# Patient Record
Sex: Female | Born: 1975 | Race: Black or African American | Hispanic: No | Marital: Single | Smoking: Never smoker
Health system: Southern US, Community
[De-identification: ages and names within clinical notes are randomized; demographics above are authoritative.]

## PROBLEM LIST (undated history)

## (undated) DIAGNOSIS — I10 Essential (primary) hypertension: Secondary | ICD-10-CM

## (undated) DIAGNOSIS — K219 Gastro-esophageal reflux disease without esophagitis: Secondary | ICD-10-CM

## (undated) DIAGNOSIS — R519 Headache, unspecified: Secondary | ICD-10-CM

## (undated) DIAGNOSIS — T7840XA Allergy, unspecified, initial encounter: Secondary | ICD-10-CM

## (undated) DIAGNOSIS — E119 Type 2 diabetes mellitus without complications: Secondary | ICD-10-CM

## (undated) HISTORY — DX: Allergy, unspecified, initial encounter: T78.40XA

## (undated) HISTORY — DX: Gastro-esophageal reflux disease without esophagitis: K21.9

## (undated) HISTORY — DX: Essential (primary) hypertension: I10

## (undated) HISTORY — PX: OTHER SURGICAL HISTORY: SHX169

## (undated) HISTORY — PX: BREAST BIOPSY: SHX20

---

## 1997-12-27 ENCOUNTER — Emergency Department (HOSPITAL_COMMUNITY): Admission: EM | Admit: 1997-12-27 | Discharge: 1997-12-27 | Payer: Self-pay | Admitting: Emergency Medicine

## 1998-01-12 ENCOUNTER — Other Ambulatory Visit: Admission: RE | Admit: 1998-01-12 | Discharge: 1998-01-12 | Payer: Self-pay | Admitting: Obstetrics

## 1998-02-15 ENCOUNTER — Emergency Department (HOSPITAL_COMMUNITY): Admission: EM | Admit: 1998-02-15 | Discharge: 1998-02-15 | Payer: Self-pay | Admitting: Emergency Medicine

## 1998-04-22 ENCOUNTER — Other Ambulatory Visit: Admission: RE | Admit: 1998-04-22 | Discharge: 1998-04-22 | Payer: Self-pay | Admitting: Obstetrics

## 1998-05-20 ENCOUNTER — Emergency Department (HOSPITAL_COMMUNITY): Admission: EM | Admit: 1998-05-20 | Discharge: 1998-05-20 | Payer: Self-pay | Admitting: Emergency Medicine

## 1998-09-30 ENCOUNTER — Ambulatory Visit (HOSPITAL_BASED_OUTPATIENT_CLINIC_OR_DEPARTMENT_OTHER): Admission: RE | Admit: 1998-09-30 | Discharge: 1998-09-30 | Payer: Self-pay | Admitting: General Surgery

## 1999-07-20 ENCOUNTER — Other Ambulatory Visit: Admission: RE | Admit: 1999-07-20 | Discharge: 1999-07-20 | Payer: Self-pay | Admitting: *Deleted

## 1999-07-25 ENCOUNTER — Encounter: Admission: RE | Admit: 1999-07-25 | Discharge: 1999-07-25 | Payer: Self-pay | Admitting: *Deleted

## 1999-07-25 ENCOUNTER — Encounter: Payer: Self-pay | Admitting: *Deleted

## 1999-09-28 ENCOUNTER — Other Ambulatory Visit: Admission: RE | Admit: 1999-09-28 | Discharge: 1999-09-28 | Payer: Self-pay | Admitting: Obstetrics & Gynecology

## 2000-02-10 ENCOUNTER — Inpatient Hospital Stay (HOSPITAL_COMMUNITY): Admission: AD | Admit: 2000-02-10 | Discharge: 2000-02-12 | Payer: Self-pay | Admitting: Obstetrics & Gynecology

## 2000-03-20 ENCOUNTER — Other Ambulatory Visit: Admission: RE | Admit: 2000-03-20 | Discharge: 2000-03-20 | Payer: Self-pay | Admitting: Obstetrics and Gynecology

## 2000-05-13 ENCOUNTER — Emergency Department (HOSPITAL_COMMUNITY): Admission: EM | Admit: 2000-05-13 | Discharge: 2000-05-13 | Payer: Self-pay | Admitting: *Deleted

## 2000-05-26 ENCOUNTER — Emergency Department (HOSPITAL_COMMUNITY): Admission: EM | Admit: 2000-05-26 | Discharge: 2000-05-27 | Payer: Self-pay | Admitting: *Deleted

## 2017-11-07 ENCOUNTER — Ambulatory Visit (INDEPENDENT_AMBULATORY_CARE_PROVIDER_SITE_OTHER): Payer: BC Managed Care – PPO | Admitting: Obstetrics & Gynecology

## 2017-11-07 ENCOUNTER — Encounter: Payer: Self-pay | Admitting: Obstetrics & Gynecology

## 2017-11-07 VITALS — BP 171/114 | HR 114 | Ht 67.0 in | Wt 200.7 lb

## 2017-11-07 DIAGNOSIS — N760 Acute vaginitis: Secondary | ICD-10-CM | POA: Diagnosis not present

## 2017-11-07 DIAGNOSIS — I10 Essential (primary) hypertension: Secondary | ICD-10-CM | POA: Diagnosis not present

## 2017-11-07 DIAGNOSIS — N920 Excessive and frequent menstruation with regular cycle: Secondary | ICD-10-CM

## 2017-11-07 DIAGNOSIS — Z1151 Encounter for screening for human papillomavirus (HPV): Secondary | ICD-10-CM | POA: Diagnosis not present

## 2017-11-07 DIAGNOSIS — Z01411 Encounter for gynecological examination (general) (routine) with abnormal findings: Secondary | ICD-10-CM

## 2017-11-07 DIAGNOSIS — Z1231 Encounter for screening mammogram for malignant neoplasm of breast: Secondary | ICD-10-CM | POA: Diagnosis not present

## 2017-11-07 DIAGNOSIS — B9689 Other specified bacterial agents as the cause of diseases classified elsewhere: Secondary | ICD-10-CM

## 2017-11-07 DIAGNOSIS — Z124 Encounter for screening for malignant neoplasm of cervix: Secondary | ICD-10-CM

## 2017-11-07 DIAGNOSIS — Z113 Encounter for screening for infections with a predominantly sexual mode of transmission: Secondary | ICD-10-CM | POA: Diagnosis not present

## 2017-11-07 DIAGNOSIS — Z01419 Encounter for gynecological examination (general) (routine) without abnormal findings: Secondary | ICD-10-CM

## 2017-11-07 MED ORDER — NIFEDIPINE ER 60 MG PO TB24: 60.0000 mg | ORAL_TABLET | Freq: Every day | ORAL | 3 refills | Status: DC

## 2017-11-07 MED ORDER — TRANEXAMIC ACID 650 MG PO TABS
1300.0000 mg | ORAL_TABLET | Freq: Three times a day (TID) | ORAL | 5 refills | Status: DC
Start: 2017-11-07 — End: 2017-11-22

## 2017-11-07 NOTE — Patient Instructions (Signed)
Preventive Care 18-39 Years, Female Preventive care refers to lifestyle choices and visits with your health care provider that can promote health and wellness. What does preventive care include?  A yearly physical exam. This is also called an annual well check.  Dental exams once or twice a year.  Routine eye exams. Ask your health care provider how often you should have your eyes checked.  Personal lifestyle choices, including: ? Daily care of your teeth and gums. ? Regular physical activity. ? Eating a healthy diet. ? Avoiding tobacco and drug use. ? Limiting alcohol use. ? Practicing safe sex. ? Taking vitamin and mineral supplements as recommended by your health care provider. What happens during an annual well check? The services and screenings done by your health care provider during your annual well check will depend on your age, overall health, lifestyle risk factors, and family history of disease. Counseling Your health care provider may ask you questions about your:  Alcohol use.  Tobacco use.  Drug use.  Emotional well-being.  Home and relationship well-being.  Sexual activity.  Eating habits.  Work and work Statistician.  Method of birth control.  Menstrual cycle.  Pregnancy history.  Screening You may have the following tests or measurements:  Height, weight, and BMI.  Diabetes screening. This is done by checking your blood sugar (glucose) after you have not eaten for a while (fasting).  Blood pressure.  Lipid and cholesterol levels. These may be checked every 5 years starting at age 66.  Skin check.  Hepatitis C blood test.  Hepatitis B blood test.  Sexually transmitted disease (STD) testing.  BRCA-related cancer screening. This may be done if you have a family history of breast, ovarian, tubal, or peritoneal cancers.  Pelvic exam and Pap test. This may be done every 3 years starting at age 40. Starting at age 59, this may be done every 5  years if you have a Pap test in combination with an HPV test.  Discuss your test results, treatment options, and if necessary, the need for more tests with your health care provider. Vaccines Your health care provider may recommend certain vaccines, such as:  Influenza vaccine. This is recommended every year.  Tetanus, diphtheria, and acellular pertussis (Tdap, Td) vaccine. You may need a Td booster every 10 years.  Varicella vaccine. You may need this if you have not been vaccinated.  HPV vaccine. If you are 69 or younger, you may need three doses over 6 months.  Measles, mumps, and rubella (MMR) vaccine. You may need at least one dose of MMR. You may also need a second dose.  Pneumococcal 13-valent conjugate (PCV13) vaccine. You may need this if you have certain conditions and were not previously vaccinated.  Pneumococcal polysaccharide (PPSV23) vaccine. You may need one or two doses if you smoke cigarettes or if you have certain conditions.  Meningococcal vaccine. One dose is recommended if you are age 27-21 years and a first-year college student living in a residence hall, or if you have one of several medical conditions. You may also need additional booster doses.  Hepatitis A vaccine. You may need this if you have certain conditions or if you travel or work in places where you may be exposed to hepatitis A.  Hepatitis B vaccine. You may need this if you have certain conditions or if you travel or work in places where you may be exposed to hepatitis B.  Haemophilus influenzae type b (Hib) vaccine. You may need this if  you have certain risk factors.  Talk to your health care provider about which screenings and vaccines you need and how often you need them. This information is not intended to replace advice given to you by your health care provider. Make sure you discuss any questions you have with your health care provider. Document Released: 10/02/2001 Document Revised: 04/25/2016  Document Reviewed: 06/07/2015 Elsevier Interactive Patient Education  [employer].

## 2017-11-07 NOTE — Progress Notes (Signed)
GYNECOLOGY ANNUAL PREVENTATIVE CARE ENCOUNTER NOTE  Subjective:   Nicole Ryan is a 42 y.o. [phone] female here for a routine annual gynecologic exam.  Current complaints: wants medication for uncontrolled BP, currently on Nifedipine XR 30 daily.  Also reports heavy menstrual periods since birth of last child; does not want hormones.  Denies abnormal vaginal discharge, pelvic pain, problems with intercourse or other gynecologic concerns.    Gynecologic History Patient's last menstrual period was 10/07/2017 (within days). Contraception: none Last Pap: 2018. Results were: normal   Obstetric History OB History  Gravida Para Term Preterm AB Living  4 3 3   1 3   SAB TAB Ectopic Multiple Live Births  1       3    # Outcome Date GA Lbr Len/2nd Weight Sex Delivery Anes PTL Lv  4 SAB 09/03/16 [redacted]w[redacted]d         3 Term 05/03/06 [redacted]w[redacted]d   F Vag-Spont EPI  LIV  2 Term 02/10/00 [redacted]w[redacted]d   M Vag-Spont EPI  LIV  1 Term 09/13/97 [redacted]w[redacted]d   F Vag-Spont EPI  LIV    Past Medical History:  Diagnosis Date  . Hypertension     Past Surgical History:  Procedure Laterality Date  . breast surg Bilateral    Beign tumors both breast    No current outpatient medications on file prior to visit.   No current facility-administered medications on file prior to visit.     Not on File  Social History   Socioeconomic History  . Marital status: Single    Spouse name: Not on file  . Number of children: Not on file  . Years of education: Not on file  . Highest education level: Not on file  Occupational History  . Not on file  Social Needs  . Financial resource strain: Not on file  . Food insecurity:    Worry: Not on file    Inability: Not on file  . Transportation needs:    Medical: Not on file    Non-medical: Not on file  Tobacco Use  . Smoking status: Never Smoker  . Smokeless tobacco: Never Used  Substance and Sexual Activity  . Alcohol use: Not Currently    Frequency: Never  . Drug use:  Never  . Sexual activity: Not Currently    Birth control/protection: None  Lifestyle  . Physical activity:    Days per week: Not on file    Minutes per session: Not on file  . Stress: Not on file  Relationships  . Social connections:    Talks on phone: Not on file    Gets together: Not on file    Attends religious service: Not on file    Active member of club or organization: Not on file    Attends meetings of clubs or organizations: Not on file    Relationship status: Not on file  . Intimate partner violence:    Fear of current or ex partner: Not on file    Emotionally abused: Not on file    Physically abused: Not on file    Forced sexual activity: Not on file  Other Topics Concern  . Not on file  Social History Narrative  . Not on file    Family History  Problem Relation Age of Onset  . Aneurysm Mother   . AAA (abdominal aortic aneurysm) Father     The following portions of the patient's history were reviewed and updated as appropriate: allergies, current  medications, past family history, past medical history, past social history, past surgical history and problem list.  Review of Systems Pertinent items noted in HPI and remainder of comprehensive ROS otherwise negative.   Objective:  BP (!) 171/114   Pulse (!) 114   Ht 5\' 7"  (1.702 m)   Wt 200 lb 11.2 oz (91 kg)   LMP 10/07/2017 (Within Days)   BMI 31.43 kg/m  CONSTITUTIONAL: Well-developed, well-nourished female in no acute distress.  HENT:  Normocephalic, atraumatic, External right and left ear normal. Oropharynx is clear and moist EYES: Conjunctivae and EOM are normal. Pupils are equal, round, and reactive to light. No scleral icterus.  NECK: Normal range of motion, supple, no masses.  Normal thyroid.  SKIN: Skin is warm and dry. No rash noted. Not diaphoretic. No erythema. No pallor. NEUROLOGIC: Alert and oriented to person, place, and time. Normal reflexes, muscle tone coordination. No cranial nerve deficit  noted. PSYCHIATRIC: Normal mood and affect. Normal behavior. Normal judgment and thought content. CARDIOVASCULAR: Normal heart rate noted, regular rhythm RESPIRATORY: Clear to auscultation bilaterally. Effort and breath sounds normal, no problems with respiration noted. BREASTS: Symmetric in size. No masses, skin changes, nipple drainage, or lymphadenopathy. ABDOMEN: Soft, normal bowel sounds, no distention noted.  No tenderness, rebound or guarding.  PELVIC: Normal appearing external genitalia; normal appearing vaginal mucosa and cervix.  No abnormal discharge noted.  Pap smear obtained.  Normal uterine size, no other palpable masses, no uterine or adnexal tenderness. MUSCULOSKELETAL: Normal range of motion. No tenderness.  No cyanosis, clubbing, or edema.  2+ distal pulses.   Assessment and Plan:  1. Essential hypertension Increased dosage to 60 mg daily. Will follow up with PCP ASAP. - NIFEdipine (PROCARDIA-XL/ADALAT CC) 60 MG 24 hr tablet; Take 1 tablet (60 mg total) by mouth daily.  Dispense: 30 tablet; Refill: 3  2. Menorrhagia with regular cycle - Cytology - PAP ancillary testing done - US PELVIC COMPLETE WITH TRANSVAGINAL; Future - tranexamic acid (LYSTEDA) 650 MG TABS tablet; Take 2 tablets (1,300 mg total) by mouth 3 (three) times daily. Take during menses for a maximum of five days  Dispense: 30 tablet; Refill: 5  3. Visit for screening mammogram Mammogram scheduled - MM SCREENING BREAST TOMO BILATERAL; Future  4. Encounter for gynecological examination with Papanicolaou smear of cervix - Cytology - PAP Will follow up results of pap smear and manage accordingly.  Routine preventative health maintenance measures emphasized. Please refer to After Visit Summary for other counseling recommendations.    Verita Schneiders, MD, [place] [facility], [place]

## 2017-11-12 ENCOUNTER — Telehealth: Payer: Self-pay

## 2017-11-12 LAB — CYTOLOGY - PAP
BACTERIAL VAGINITIS: POSITIVE — AB
Candida vaginitis: NEGATIVE
Chlamydia: NEGATIVE
DIAGNOSIS: NEGATIVE
HPV: NOT DETECTED
Neisseria Gonorrhea: NEGATIVE
Trichomonas: NEGATIVE

## 2017-11-12 MED ORDER — METRONIDAZOLE 500 MG PO TABS: 500.0000 mg | ORAL_TABLET | Freq: Two times a day (BID) | ORAL | 0 refills | Status: DC

## 2017-11-12 NOTE — Progress Notes (Signed)
Patient notified of results and Rx. 

## 2017-11-12 NOTE — Addendum Note (Signed)
Addended by: Verita Schneiders A on: 11/12/2017 11:58 AM   Modules accepted: Orders

## 2017-11-12 NOTE — Telephone Encounter (Signed)
-----   Message from Osborne Oman, MD sent at 11/12/2017 11:58 AM EDT ----- Vaginal discharge test is abnormal and showed bacterial vaginitis. Metronidazole was prescribed. Please inform patient of results and advise to pick up prescription.

## 2017-11-12 NOTE — Telephone Encounter (Signed)
Patient notified of results and RX 

## 2017-11-14 ENCOUNTER — Encounter: Payer: Self-pay | Admitting: Medical

## 2017-11-14 ENCOUNTER — Ambulatory Visit: Payer: BC Managed Care – PPO | Admitting: Medical

## 2017-11-14 VITALS — BP 130/87 | HR 103 | Resp 16 | Ht 67.0 in | Wt 201.0 lb

## 2017-11-14 DIAGNOSIS — R5383 Other fatigue: Secondary | ICD-10-CM

## 2017-11-14 DIAGNOSIS — J301 Allergic rhinitis due to pollen: Secondary | ICD-10-CM | POA: Diagnosis not present

## 2017-11-14 DIAGNOSIS — I10 Essential (primary) hypertension: Secondary | ICD-10-CM | POA: Diagnosis not present

## 2017-11-14 DIAGNOSIS — R12 Heartburn: Secondary | ICD-10-CM | POA: Diagnosis not present

## 2017-11-14 NOTE — Patient Instructions (Addendum)
You associate your recent fatigue with medications your gynecologist gave you.  We discussed this today and will see if your energy normalizes by early next week.  If not then we can do lab workup for fatigue as we discussed today.(pt decline labs today)  For history of hypertension continue with current medication.  BP readings over the last 3 days look good.  If your BP readings start to trend upwards over 140/90 then might need to add additional medication.  For occasional heartburn recommend healthy diet.  If you do have heartburn despite healthy diet then use omeprazole or ranitidine/Zantac over-the-counter.  For allergic rhinitis, can continue Claritin and Flonase.  No recent severe flare presently.  If over the next 2-3 weeks as allergy season starts you have worse symptoms then let me know and I could prescribe different medications.  Follow-up with them the next month for complete physical exam.  Schedule early morning appointment and come in fasting for labs could be done.

## 2017-11-14 NOTE — Progress Notes (Signed)
Subjective:    Patient ID: Nicole Ryan, female    DOB: [birth year], 42 y.o.   MRN: [id number]  HPI  Pt in for first time.  Pt works as Control and instrumentation engineer, She does not exercise regularly, no caffeine beverage other than tea 3-4 times a weak, non smoker, rare alcohol. Single. 3 children. 2 girls and 1 boy. Pt likes going to movies on spare time.  Pt does report some fatigue x 3  day. No recent labs done. More than a year since any done. Does see gyn. Recent papsmear done.  Pt seems to associate fatigue with flaygl and lysteda given by gyn(hx of heavy cycles). She will be on this until Monday.  Pt does have high blood pressure and has been on procardia for more than a year.  When on procardia bp well controlled but if skip dose will go up [place].Last 3 days 123/68, 131/83, and 134/88. Pt has been checking her bp recently more frequently.    Review of Systems  Constitutional: Positive for fatigue. Negative for chills and fever.  Respiratory: Negative for cough, choking, shortness of breath and wheezing.   Cardiovascular: Negative for chest pain and palpitations.  Gastrointestinal: Negative for abdominal pain.  Musculoskeletal: Negative for back pain, myalgias and neck stiffness.  Skin: Negative for rash.  Neurological: Negative for dizziness, speech difficulty, weakness and light-headedness.  Hematological: Negative for adenopathy. Does not bruise/bleed easily.  Psychiatric/Behavioral: Negative for behavioral problems, decreased concentration and suicidal ideas. The patient is not hyperactive.     Past Medical History:  Diagnosis Date  . Hypertension      Social History   Socioeconomic History  . Marital status: Single    Spouse name: Not on file  . Number of children: Not on file  . Years of education: Not on file  . Highest education level: Not on file  Occupational History  . Not on file  Social Needs  . Financial resource strain: Not on file  . Food insecurity:      Worry: Not on file    Inability: Not on file  . Transportation needs:    Medical: Not on file    Non-medical: Not on file  Tobacco Use  . Smoking status: Never Smoker  . Smokeless tobacco: Never Used  Substance and Sexual Activity  . Alcohol use: Not Currently    Frequency: Never  . Drug use: Never  . Sexual activity: Not Currently    Birth control/protection: None  Lifestyle  . Physical activity:    Days per week: Not on file    Minutes per session: Not on file  . Stress: Not on file  Relationships  . Social connections:    Talks on phone: Not on file    Gets together: Not on file    Attends religious service: Not on file    Active member of club or organization: Not on file    Attends meetings of clubs or organizations: Not on file    Relationship status: Not on file  . Intimate partner violence:    Fear of current or ex partner: Not on file    Emotionally abused: Not on file    Physically abused: Not on file    Forced sexual activity: Not on file  Other Topics Concern  . Not on file  Social History Narrative  . Not on file    Past Surgical History:  Procedure Laterality Date  . breast surg Bilateral    Beign tumors both  breast    Family History  Problem Relation Age of Onset  . Aneurysm Mother   . AAA (abdominal aortic aneurysm) Father     Not on File  Current Outpatient Medications on File Prior to Visit  Medication Sig Dispense Refill  . metroNIDAZOLE (FLAGYL) 500 MG tablet Take 1 tablet (500 mg total) by mouth 2 (two) times daily. 14 tablet 0  . NIFEdipine (PROCARDIA-XL/ADALAT CC) 60 MG 24 hr tablet Take 1 tablet (60 mg total) by mouth daily. 30 tablet 3  . tranexamic acid (LYSTEDA) 650 MG TABS tablet Take 2 tablets (1,300 mg total) by mouth 3 (three) times daily. Take during menses for a maximum of five days 30 tablet 5   No current facility-administered medications on file prior to visit.     BP 134/88 (BP Location: Left Arm, Patient Position:  Sitting, Cuff Size: Large)   Pulse (!) 106   Resp 16   Ht 5\' 7"  (1.702 m)   Wt 201 lb (91.2 kg)   SpO2 99%   BMI 31.48 kg/m       Objective:   Physical Exam  General Mental Status- Alert. General Appearance- Not in acute distress.   Skin General: Color- Normal Color. Moisture- Normal Moisture.  Neck Carotid Arteries- Normal color. Moisture- Normal Moisture. No carotid bruits. No JVD.  Chest and Lung Exam Auscultation: Breath Sounds:-Normal.  Cardiovascular Auscultation:Rythm- Regular. Murmurs & Other Heart Sounds:Auscultation of the heart reveals- No Murmurs.  Neurologic Cranial Nerve exam:- CN III-XII intact(No nystagmus), symmetric smile. Strength:- 5/5 equal and symmetric strength both upper and lower extremities.      Assessment & Plan:  You associate your recent fatigue with medications your gynecologist gave you.  We discussed this today and will see if your energy normalizes by early next week.  If not then we can do lab workup for fatigue as we discussed today.(but pt declined today)  For history of hypertension continue with current medication.  BP readings over the last 3 days look good.  If your BP readings start to trend upwards over 140/90 then might need to add additional medication.  For occasional heartburn recommend healthy diet.  If you do have heartburn despite healthy diet then use omeprazole or ranitidine/Zantac over-the-counter.  For allergic rhinitis, can continue Claritin and Flonase.  No recent severe flare presently.  If over the next 2-3 weeks as allergy season starts you have worse symptoms then let me know and I could prescribe different medications.  Follow-up with them the next month for complete physical exam.  Schedule early morning appointment and come in fasting for labs could be done.  [name], PA-C

## 2017-11-22 ENCOUNTER — Ambulatory Visit: Payer: BC Managed Care – PPO | Admitting: Family

## 2017-11-22 VITALS — BP 143/71 | HR 110 | Resp 16 | Ht 67.0 in | Wt 203.2 lb

## 2017-11-22 DIAGNOSIS — I10 Essential (primary) hypertension: Secondary | ICD-10-CM | POA: Diagnosis not present

## 2017-11-22 DIAGNOSIS — R011 Cardiac murmur, unspecified: Secondary | ICD-10-CM | POA: Diagnosis not present

## 2017-11-22 DIAGNOSIS — R609 Edema, unspecified: Secondary | ICD-10-CM

## 2017-11-22 MED ORDER — METOPROLOL SUCCINATE ER 50 MG PO TB24: 50.0000 mg | ORAL_TABLET | Freq: Every day | ORAL | 2 refills | Status: DC

## 2017-11-22 NOTE — Progress Notes (Signed)
Subjective:    Patient ID: Nicole Ryan, female    DOB: 10/09/1975, 42 y.o.   MRN: [id number]  HPI   Nicole Ryan is a 42 yr old female who presents today with chief complaint of LE edema. She reports that this started about 2-3 days ago. She has been on nifedipine for blood pressure. She denies CP/SOB or palpitations.   BP Readings from Last 3 Encounters:  11/22/17 (!) 143/71  11/14/17 130/87  11/07/17 (!) 171/114    Review of Systems    see HPI  Past Medical History:  Diagnosis Date  . Allergy   . GERD (gastroesophageal reflux disease)    heart burn. 2-3 times a week.  . Hypertension      Social History   Socioeconomic History  . Marital status: Single    Spouse name: Not on file  . Number of children: Not on file  . Years of education: Not on file  . Highest education level: Not on file  Occupational History  . Occupation: Control and instrumentation engineer  Social Needs  . Financial resource strain: Not on file  . Food insecurity:    Worry: Not on file    Inability: Not on file  . Transportation needs:    Medical: Not on file    Non-medical: Not on file  Tobacco Use  . Smoking status: Never Smoker  . Smokeless tobacco: Never Used  Substance and Sexual Activity  . Alcohol use: Not Currently    Frequency: Never  . Drug use: Never  . Sexual activity: Not Currently    Birth control/protection: None  Lifestyle  . Physical activity:    Days per week: Not on file    Minutes per session: Not on file  . Stress: Not on file  Relationships  . Social connections:    Talks on phone: Not on file    Gets together: Not on file    Attends religious service: Not on file    Active member of club or organization: Not on file    Attends meetings of clubs or organizations: Not on file    Relationship status: Not on file  . Intimate partner violence:    Fear of current or ex partner: Not on file    Emotionally abused: Not on file    Physically abused: Not on file    Forced sexual  activity: Not on file  Other Topics Concern  . Not on file  Social History Narrative  . Not on file    Past Surgical History:  Procedure Laterality Date  . BREAST BIOPSY Bilateral   . breast surg Bilateral    Beign tumors both breast    Family History  Problem Relation Age of Onset  . Aneurysm Mother   . Hypertension Mother   . Diabetes Mother     Not on File  Current Outpatient Medications on File Prior to Visit  Medication Sig Dispense Refill  . NIFEdipine (PROCARDIA-XL/ADALAT CC) 60 MG 24 hr tablet Take 1 tablet (60 mg total) by mouth daily. 30 tablet 3   No current facility-administered medications on file prior to visit.     BP (!) 143/71 (BP Location: Right Arm, Patient Position: Sitting, Cuff Size: Large)   Pulse (!) 110   Resp 16   Ht 5\' 7"  (1.702 m)   Wt 203 lb 3.2 oz (92.2 kg)   SpO2 100%   BMI 31.83 kg/m    Objective:   Physical Exam  Constitutional: She appears well-developed  and well-nourished.  Cardiovascular: Normal rate and regular rhythm.  Murmur heard.  Crescendo systolic murmur is present with a grade of 2/6. Pulmonary/Chest: Effort normal and breath sounds normal. No respiratory distress. She has no wheezes.  Musculoskeletal:  2+ bilateral LE edema  Psychiatric: She has a normal mood and affect. Her behavior is normal. Judgment and thought content normal.          Assessment & Plan:  Edema- suspect secondary to calcium channel blocker, d/c CCB.  Murmur- new per patient.  Obtain 2D echo.  HTN- d/c nifedipine due to edema, trial of toprol xl.  Pt advised to follow up in 2 weeks with PCP for bp check and re-evaluation of her LE edema.

## 2017-11-22 NOTE — Patient Instructions (Signed)
Stop nifedipine, start metoprolol once daily. You will be contacted to schedule your 2D echo.

## 2017-11-28 ENCOUNTER — Ambulatory Visit: Payer: BC Managed Care – PPO

## 2017-11-28 ENCOUNTER — Other Ambulatory Visit: Payer: BC Managed Care – PPO

## 2017-12-02 ENCOUNTER — Other Ambulatory Visit: Payer: BC Managed Care – PPO

## 2017-12-02 ENCOUNTER — Ambulatory Visit: Payer: BC Managed Care – PPO | Admitting: Medical

## 2017-12-02 ENCOUNTER — Encounter: Payer: Self-pay | Admitting: Medical

## 2017-12-02 VITALS — BP 150/90 | HR 54 | Temp 98.3°F | Resp 16 | Ht 67.0 in | Wt 201.2 lb

## 2017-12-02 DIAGNOSIS — I10 Essential (primary) hypertension: Secondary | ICD-10-CM | POA: Diagnosis not present

## 2017-12-02 LAB — LIPID PANEL
CHOLESTEROL: 173 mg/dL (ref 0–200)
HDL: 75.1 mg/dL (ref >39.00)
LDL Cholesterol: 79 mg/dL (ref 0–99)
NonHDL: 97.43
Total CHOL/HDL Ratio: 2
Triglycerides: 91 mg/dL (ref 0.0–149.0)
VLDL: 18.2 mg/dL (ref 0.0–40.0)

## 2017-12-02 LAB — COMPREHENSIVE METABOLIC PANEL
ALBUMIN: 4.1 g/dL (ref 3.5–5.2)
ALK PHOS: 94 U/L (ref 39–117)
ALT: 23 U/L (ref 0–35)
AST: 25 U/L (ref 0–37)
BILIRUBIN TOTAL: 0.4 mg/dL (ref 0.2–1.2)
BUN: 9 mg/dL (ref 6–23)
CO2: 23 mEq/L (ref 19–32)
CREATININE: 0.73 mg/dL (ref 0.40–1.20)
Calcium: 9.8 mg/dL (ref 8.4–10.5)
Chloride: 104 mEq/L (ref 96–112)
GFR: 113.53 mL/min (ref >60.00)
Glucose, Bld: 102 mg/dL — ABNORMAL HIGH (ref 70–99)
POTASSIUM: 4.3 meq/L (ref 3.5–5.1)
SODIUM: 137 meq/L (ref 135–145)
TOTAL PROTEIN: 7.8 g/dL (ref 6.0–8.3)

## 2017-12-02 LAB — CBC WITH DIFFERENTIAL/PLATELET
BASOS PCT: 1.6 % (ref 0.0–3.0)
Basophils Absolute: 0.1 10*3/uL (ref 0.0–0.1)
EOS PCT: 1 % (ref 0.0–5.0)
Eosinophils Absolute: 0.1 10*3/uL (ref 0.0–0.7)
HCT: 37.2 % (ref 36.0–46.0)
HEMOGLOBIN: 11.6 g/dL — AB (ref 12.0–15.0)
LYMPHS PCT: 38 % (ref 12.0–46.0)
Lymphs Abs: 2.5 10*3/uL (ref 0.7–4.0)
MCHC: 31.2 g/dL (ref 30.0–36.0)
MCV: 64.6 fl — ABNORMAL LOW (ref 78.0–100.0)
Monocytes Absolute: 0.6 10*3/uL (ref 0.1–1.0)
Monocytes Relative: 9.8 % (ref 3.0–12.0)
Neutro Abs: 3.2 10*3/uL (ref 1.4–7.7)
Neutrophils Relative %: 49.6 % (ref 43.0–77.0)
Platelets: 573 10*3/uL — ABNORMAL HIGH (ref 150.0–400.0)
RBC: 5.76 Mil/uL — ABNORMAL HIGH (ref 3.87–5.11)
RDW: 20.5 % — AB (ref 11.5–15.5)
WBC: 6.5 10*3/uL (ref 4.0–10.5)

## 2017-12-02 MED ORDER — HYDROCHLOROTHIAZIDE 12.5 MG PO CAPS
12.5000 mg | ORAL_CAPSULE | Freq: Every day | ORAL | 3 refills | Status: DC
Start: 2017-12-02 — End: 2018-04-13

## 2017-12-02 NOTE — Patient Instructions (Addendum)
Your blood pressure has been elevated recently at home and today.  Your switched from Procardia to metoprolol recently.  It appears dose not adequate to control bp..  This can happen when changing medications.  Your pulse is a little low today and I do think he would benefit from additional medication called HCTZ.  I want you to continue to check your blood pressure daily and also note of pulse readings.  Please write both of those down and will review those readings and 2 weeks.  Would recommend that you eat some occasional potassium rich foods to make sure potassium does not drop.  We will recheck potassium on follow-up visit.   Please get CBC, CMP and lipid panel today.  Please get echo done on the first as scheduled.  Follow-up in approximately 2 weeks or as needed.

## 2017-12-02 NOTE — Progress Notes (Addendum)
Subjective:    Patient ID: Nicole Ryan, female    DOB: 02/21/1976, 42 y.o.   MRN: [id number]  HPI  Pt in for follow up.   Pt had swelling on her last visit. Pt may have possible pedal edema associated with CA blocker. Pt was medication before but did get edema on  Med recently.  This morning systolic was 518. The other reading early this week was 841 systolic but can't remember diastolic.  Last provider stated heard murmur. Pt has echo scheduled for Dec 18, 2017.  No chest pain, no sob or any cardiac sign or symptoms.  Review of Systems  Constitutional: Negative for chills, fatigue and fever.       No longer fatigue.  Respiratory: Negative for cough, chest tightness, shortness of breath and wheezing.   Cardiovascular: Negative for chest pain and palpitations.  Gastrointestinal: Negative for abdominal pain.  Musculoskeletal: Negative for back pain.  Skin: Negative for rash.  Neurological: Negative for dizziness, syncope, weakness, numbness and headaches.  Hematological: Negative for adenopathy. Does not bruise/bleed easily.  Psychiatric/Behavioral: Negative for behavioral problems, confusion, self-injury and suicidal ideas.    Past Medical History:  Diagnosis Date  . Allergy   . GERD (gastroesophageal reflux disease)    heart burn. 2-3 times a week.  . Hypertension      Social History   Socioeconomic History  . Marital status: Single    Spouse name: Not on file  . Number of children: Not on file  . Years of education: Not on file  . Highest education level: Not on file  Occupational History  . Occupation: Control and instrumentation engineer  Social Needs  . Financial resource strain: Not on file  . Food insecurity:    Worry: Not on file    Inability: Not on file  . Transportation needs:    Medical: Not on file    Non-medical: Not on file  Tobacco Use  . Smoking status: Never Smoker  . Smokeless tobacco: Never Used  Substance and Sexual Activity  . Alcohol use: Not  Currently    Frequency: Never  . Drug use: Never  . Sexual activity: Not Currently    Birth control/protection: None  Lifestyle  . Physical activity:    Days per week: Not on file    Minutes per session: Not on file  . Stress: Not on file  Relationships  . Social connections:    Talks on phone: Not on file    Gets together: Not on file    Attends religious service: Not on file    Active member of club or organization: Not on file    Attends meetings of clubs or organizations: Not on file    Relationship status: Not on file  . Intimate partner violence:    Fear of current or ex partner: Not on file    Emotionally abused: Not on file    Physically abused: Not on file    Forced sexual activity: Not on file  Other Topics Concern  . Not on file  Social History Narrative  . Not on file    Past Surgical History:  Procedure Laterality Date  . BREAST BIOPSY Bilateral   . breast surg Bilateral    Beign tumors both breast    Family History  Problem Relation Age of Onset  . Aneurysm Mother   . Hypertension Mother   . Diabetes Mother     Not on File  Current Outpatient Medications on File Prior to Visit  Medication Sig Dispense Refill  . metoprolol succinate (TOPROL-XL) 50 MG 24 hr tablet Take 1 tablet (50 mg total) by mouth daily. Take with or immediately following a meal. 30 tablet 2   No current facility-administered medications on file prior to visit.     BP (!) 154/76   Pulse (!) 54   Temp 98.3 F (36.8 C) (Oral)   Resp 16   Ht 5\' 7"  (1.702 m)   Wt 201 lb 3.2 oz (91.3 kg)   SpO2 100%   BMI 31.51 kg/m       Objective:   Physical Exam  General Mental Status- Alert. General Appearance- Not in acute distress.   Skin General: Color- Normal Color. Moisture- Normal Moisture.  Neck Carotid Arteries- Normal color. Moisture- Normal Moisture. No carotid bruits. No JVD.  Chest and Lung Exam Auscultation: Breath Sounds:-Normal.  Cardiovascular Regular rate  and rhythm.  Abdomen Inspection:-Inspeection Normal. Palpation/Percussion:Note:No mass. Palpation and Percussion of the abdomen reveal- Non Tender, Non Distended + BS, no rebound or guarding.   Neurologic Cranial Nerve exam:- CN III-XII intact(No nystagmus), symmetric smile. Strength:- 5/5 equal and symmetric strength both upper and lower extremities.  Lower ext- no pedal edema.     Assessment & Plan:  Your blood pressure has been elevated recently at home and today.  Your switched from Procardia to metoprolol recently.  It appears dose not adequate to control bp.  This can happen when changing medications.  Your pulse is a little low today and I do think he would benefit from additional medication called HCTZ.  I want you to continue to check your blood pressure daily and also note of pulse readings.  Please write both of those down and will review those readings and 2 weeks.  Would recommend that you eat some occasional potassium rich foods to make sure potassium does not drop.  We will recheck potassium on follow-up visit.   Please get CBC, CMP and lipid panel today.  Please get echo done on the first as scheduled.  Follow-up in approximately 2 weeks or as needed.  [name], PA-C

## 2017-12-03 ENCOUNTER — Ambulatory Visit
Admission: RE | Admit: 2017-12-03 | Discharge: 2017-12-03 | Disposition: A | Discharge status: Home / Self Care | Payer: BC Managed Care – PPO | Source: Ambulatory Visit | Attending: Obstetrics & Gynecology | Admitting: Obstetrics & Gynecology

## 2017-12-03 DIAGNOSIS — N920 Excessive and frequent menstruation with regular cycle: Secondary | ICD-10-CM

## 2017-12-03 DIAGNOSIS — Z1231 Encounter for screening mammogram for malignant neoplasm of breast: Secondary | ICD-10-CM

## 2017-12-18 ENCOUNTER — Encounter: Payer: Self-pay | Admitting: Medical

## 2017-12-18 ENCOUNTER — Ambulatory Visit (HOSPITAL_BASED_OUTPATIENT_CLINIC_OR_DEPARTMENT_OTHER): Payer: BC Managed Care – PPO

## 2017-12-18 ENCOUNTER — Ambulatory Visit: Payer: BC Managed Care – PPO | Admitting: Medical

## 2017-12-18 VITALS — BP 130/85 | HR 70 | Temp 98.2°F | Resp 16 | Ht 67.0 in | Wt 201.8 lb

## 2017-12-18 DIAGNOSIS — J301 Allergic rhinitis due to pollen: Secondary | ICD-10-CM | POA: Diagnosis not present

## 2017-12-18 DIAGNOSIS — I10 Essential (primary) hypertension: Secondary | ICD-10-CM

## 2017-12-18 MED ORDER — LEVOCETIRIZINE DIHYDROCHLORIDE 5 MG PO TABS: 5.0000 mg | ORAL_TABLET | Freq: Every evening | ORAL | 3 refills | Status: DC

## 2017-12-18 MED ORDER — MONTELUKAST SODIUM 10 MG PO TABS: 10.0000 mg | ORAL_TABLET | Freq: Every day | ORAL | 3 refills | Status: DC

## 2017-12-18 MED ORDER — FLUTICASONE PROPIONATE 50 MCG/ACT NA SUSP: 2.0000 | Freq: Every day | NASAL | 11 refills | Status: DC

## 2017-12-18 NOTE — Progress Notes (Signed)
Subjective:    Patient ID: Nicole Ryan, female    DOB: [birth year], 42 y.o.   MRN: [id number]  HPI  Pt in for bp check. No side effects with bp medications. Pt has checked her bp at home and readings have been 158/84, 144/77, 133/73, 135/69, and 137/73. Low number are most recent.   Recent also allergy symptoms. As explained on ROS. Tried claritin and flonase for 2 days.  Lmp- 2 and half weeks ago.    Review of Systems  Constitutional: Negative for chills, fatigue and fever.  HENT: Positive for congestion and postnasal drip. Negative for sinus pressure, sinus pain, sneezing and trouble swallowing.        5 days of allergy symptoms.  Some itchy throat.  Eyes: Positive for itching.       Swollen lowe eye lids with itch.     Respiratory: Positive for cough. Negative for choking, shortness of breath and wheezing.        From pnd.  Cardiovascular: Negative for chest pain and palpitations.  Musculoskeletal: Negative for back pain.  Skin: Negative for rash.  Hematological: Negative for adenopathy. Does not bruise/bleed easily.  Psychiatric/Behavioral: Negative for behavioral problems, confusion and sleep disturbance. The patient is not nervous/anxious.     Past Medical History:  Diagnosis Date  . Allergy   . GERD (gastroesophageal reflux disease)    heart burn. 2-3 times a week.  . Hypertension      Social History   Socioeconomic History  . Marital status: Single    Spouse name: Not on file  . Number of children: Not on file  . Years of education: Not on file  . Highest education level: Not on file  Occupational History  . Occupation: Control and instrumentation engineer  Social Needs  . Financial resource strain: Not on file  . Food insecurity:    Worry: Not on file    Inability: Not on file  . Transportation needs:    Medical: Not on file    Non-medical: Not on file  Tobacco Use  . Smoking status: Never Smoker  . Smokeless tobacco: Never Used  Substance and Sexual Activity    . Alcohol use: Not Currently    Frequency: Never  . Drug use: Never  . Sexual activity: Not Currently    Birth control/protection: None  Lifestyle  . Physical activity:    Days per week: Not on file    Minutes per session: Not on file  . Stress: Not on file  Relationships  . Social connections:    Talks on phone: Not on file    Gets together: Not on file    Attends religious service: Not on file    Active member of club or organization: Not on file    Attends meetings of clubs or organizations: Not on file    Relationship status: Not on file  . Intimate partner violence:    Fear of current or ex partner: Not on file    Emotionally abused: Not on file    Physically abused: Not on file    Forced sexual activity: Not on file  Other Topics Concern  . Not on file  Social History Narrative  . Not on file    Past Surgical History:  Procedure Laterality Date  . BREAST BIOPSY Bilateral   . breast surg Bilateral    Beign tumors both breast    Family History  Problem Relation Age of Onset  . Aneurysm Mother   . Hypertension  Mother   . Diabetes Mother     Not on File  Current Outpatient Medications on File Prior to Visit  Medication Sig Dispense Refill  . hydrochlorothiazide (MICROZIDE) 12.5 MG capsule Take 1 capsule (12.5 mg total) by mouth daily. 30 capsule 3  . metoprolol succinate (TOPROL-XL) 50 MG 24 hr tablet Take 1 tablet (50 mg total) by mouth daily. Take with or immediately following a meal. 30 tablet 2   No current facility-administered medications on file prior to visit.     BP (!) 143/55   Pulse 70   Temp 98.2 F (36.8 C) (Oral)   Resp 16   Ht 5\' 7"  (1.702 m)   Wt 201 lb 12.8 oz (91.5 kg)   SpO2 100%   BMI 31.61 kg/m       Objective:   Physical Exam  General  Mental Status - Alert. General Appearance - Well groomed. Not in acute distress.  Skin Rashes- No Rashes.  HEENT Head- Normal. Ear Auditory Canal - Left- Normal. Right -  Normal.Tympanic Membrane- Left- Normal. Right- Normal. Eye Sclera/Conjunctiva- Left- Normal. Right- Normal. Nose & Sinuses Nasal Mucosa- Left-  Boggy and Congested. Right-  Boggy and  Congested.Bilateral  No maxillary and no  frontal sinus pressure. Mouth & Throat Lips: Upper Lip- Normal: no dryness, cracking, pallor, cyanosis, or vesicular eruption. Lower Lip-Normal: no dryness, cracking, pallor, cyanosis or vesicular eruption. Buccal Mucosa- Bilateral- No Aphthous ulcers. Oropharynx- No Discharge or Erythema. +pnd Tonsils: Characteristics- Bilateral- No Erythema or Congestion. Size/Enlargement- Bilateral- No enlargement. Discharge- bilateral-None.  Neck Neck- Supple. No Masses.   Chest and Lung Exam Auscultation: Breath Sounds:-Clear even and unlabored.  Cardiovascular Auscultation:Rythm- Regular, rate and rhythm. Murmurs & Other Heart Sounds:Ausculatation of the heart reveal- No Murmurs.  Lymphatic Head & Neck General Head & Neck Lymphatics: Bilateral: Description- No Localized lymphadenopathy.       Assessment & Plan:  Bp recently well controlled. Continue current management.   For allergies will rx flonase, xyzal, and montelukast. Let us know if this works better. Stop claritin.  Follow up in 3 months or as needed  [employer], [employer]

## 2017-12-18 NOTE — Patient Instructions (Addendum)
Bp recently well controlled. Continue current management.   For allergies will rx flonase, xyzal, and montelukast. Let us know if this works better. Stop claritin.  Follow up in 3 months or as needed

## 2017-12-23 ENCOUNTER — Ambulatory Visit: Payer: BC Managed Care – PPO | Admitting: Medical

## 2018-01-01 ENCOUNTER — Other Ambulatory Visit: Payer: Self-pay | Admitting: Obstetrics & Gynecology

## 2018-01-01 DIAGNOSIS — N760 Acute vaginitis: Principal | ICD-10-CM

## 2018-01-01 DIAGNOSIS — B9689 Other specified bacterial agents as the cause of diseases classified elsewhere: Secondary | ICD-10-CM

## 2018-02-24 ENCOUNTER — Other Ambulatory Visit: Payer: Self-pay | Admitting: Family

## 2018-03-20 ENCOUNTER — Ambulatory Visit: Payer: BC Managed Care – PPO | Admitting: Medical

## 2018-04-01 ENCOUNTER — Ambulatory Visit: Payer: BC Managed Care – PPO | Admitting: Medical

## 2018-04-03 ENCOUNTER — Ambulatory Visit: Payer: BC Managed Care – PPO | Admitting: Medical

## 2018-04-09 ENCOUNTER — Ambulatory Visit: Payer: BC Managed Care – PPO | Admitting: Medical

## 2018-04-10 ENCOUNTER — Telehealth: Payer: Self-pay | Admitting: Medical

## 2018-04-10 NOTE — Telephone Encounter (Signed)
Recent no show but don't charge. Call and offer follow up. But avoid Thursday afternoon for her.

## 2018-04-10 NOTE — Telephone Encounter (Signed)
Opened to review 

## 2018-04-13 ENCOUNTER — Other Ambulatory Visit: Payer: Self-pay | Admitting: Medical

## 2018-04-22 ENCOUNTER — Encounter: Payer: Self-pay | Admitting: Medical

## 2018-05-08 ENCOUNTER — Ambulatory Visit: Payer: BC Managed Care – PPO | Admitting: Medical

## 2018-05-08 ENCOUNTER — Encounter: Payer: Self-pay | Admitting: Medical

## 2018-05-08 VITALS — BP 150/80 | HR 82 | Temp 98.4°F | Resp 16 | Ht 67.0 in | Wt 211.8 lb

## 2018-05-08 DIAGNOSIS — R252 Cramp and spasm: Secondary | ICD-10-CM

## 2018-05-08 DIAGNOSIS — N920 Excessive and frequent menstruation with regular cycle: Secondary | ICD-10-CM | POA: Diagnosis not present

## 2018-05-08 DIAGNOSIS — I10 Essential (primary) hypertension: Secondary | ICD-10-CM | POA: Diagnosis not present

## 2018-05-08 MED ORDER — HYDROCHLOROTHIAZIDE 12.5 MG PO CAPS: ORAL_CAPSULE | ORAL | 3 refills | Status: DC

## 2018-05-08 MED ORDER — METOPROLOL SUCCINATE ER 50 MG PO TB24: 50.0000 mg | ORAL_TABLET | Freq: Every day | ORAL | 2 refills | Status: DC

## 2018-05-08 NOTE — Patient Instructions (Addendum)
Your blood pressure is elevated today but you are not on your diuretic recently.  I did go ahead and refill both the metoprolol and HCTZ.  Please start taking both daily as we discussed.  I want you start checking your blood pressure daily for the next 5 to 7 days and give [place] update on those readings.  I want to see blood pressure reading less than 140/90 consistently.  If not then we need to adjust BP medication regimen.  You  mentioned some calf crampingin calfs at times.  I did go ahead and place future metabolic panel and magnesium lab order.  We will see if electrolyte abnormality causing cramping.  With HCTZ we usually recommend eating potassium rich foods to prevent low potassium level.  We discussed your recent heavy menstrual cycles.  I reviewed your last gynecologist visit note.  She had recommended taking lysteda 2 tablets at 3 times daily during menstrual cycle.  But you expressed that it is too frequent and you would often forget dosing.  So you could try 2 tablets twice daily instead.  Please get labs scheduled within a week.    Follow-up date to be determined depending on lab review and 1 week of blood pressure readings.  The recommend that you get complete physical exam/wellness exam in the spring as well.

## 2018-05-08 NOTE — Progress Notes (Signed)
Subjective:    Patient ID: Nicole Ryan, female    DOB: [birth year], 42 y.o.   MRN: [id number]  HPI  Pt in for follow up.  Pt had been on metoprolol and hctz. Pt bp in office in may was 130/85. She has not been checking her blood pressure since last visit. She is on metroprolol but has been off of hctz.  Appears I had written her hctz rx on 04/15/2018. So not sure why she is under impression she needed another rx? But resent today in effect issue transimtting electronically?  No cardiac or neurologic signs or symptoms. Pt takes metoprolol in evening.   LMP- on presently. States it is very heavy cycle. She has seen gyn  In past and has problem with complaiance with med they advised. She states difficult to take the medication 3 times daily.   Review of Systems  Constitutional: Negative for chills, fatigue and fever.  Respiratory: Negative for cough, chest tightness, shortness of breath and wheezing.   Cardiovascular: Negative for chest pain and palpitations.  Genitourinary: Negative for frequency.       Hx of heavy menses for which she has seen gyn.  Musculoskeletal: Negative for back pain.  Skin: Negative for rash.  Neurological: Negative for facial asymmetry and headaches.  Hematological: Negative for adenopathy. Does not bruise/bleed easily.  Psychiatric/Behavioral: Negative for behavioral problems and confusion.    Past Medical History:  Diagnosis Date  . Allergy   . GERD (gastroesophageal reflux disease)    heart burn. 2-3 times a week.  . Hypertension      Social History   Socioeconomic History  . Marital status: Single    Spouse name: Not on file  . Number of children: Not on file  . Years of education: Not on file  . Highest education level: Not on file  Occupational History  . Occupation: Control and instrumentation engineer  Social Needs  . Financial resource strain: Not on file  . Food insecurity:    Worry: Not on file    Inability: Not on file  . Transportation needs:      Medical: Not on file    Non-medical: Not on file  Tobacco Use  . Smoking status: Never Smoker  . Smokeless tobacco: Never Used  Substance and Sexual Activity  . Alcohol use: Not Currently    Frequency: Never  . Drug use: Never  . Sexual activity: Not Currently    Birth control/protection: None  Lifestyle  . Physical activity:    Days per week: Not on file    Minutes per session: Not on file  . Stress: Not on file  Relationships  . Social connections:    Talks on phone: Not on file    Gets together: Not on file    Attends religious service: Not on file    Active member of club or organization: Not on file    Attends meetings of clubs or organizations: Not on file    Relationship status: Not on file  . Intimate partner violence:    Fear of current or ex partner: Not on file    Emotionally abused: Not on file    Physically abused: Not on file    Forced sexual activity: Not on file  Other Topics Concern  . Not on file  Social History Narrative  . Not on file    Past Surgical History:  Procedure Laterality Date  . BREAST BIOPSY Bilateral   . breast surg Bilateral    Beign  tumors both breast    Family History  Problem Relation Age of Onset  . Aneurysm Mother   . Hypertension Mother   . Diabetes Mother     Not on File  Current Outpatient Medications on File Prior to Visit  Medication Sig Dispense Refill  . fluticasone (FLONASE) 50 MCG/ACT nasal spray Place 2 sprays into both nostrils daily. 16 g 11  . levocetirizine (XYZAL) 5 MG tablet Take 1 tablet (5 mg total) by mouth every evening. 90 tablet 3  . montelukast (SINGULAIR) 10 MG tablet Take 1 tablet (10 mg total) by mouth at bedtime. 90 tablet 3   No current facility-administered medications on file prior to visit.     BP (!) 150/80   Pulse 82   Temp 98.4 F (36.9 C) (Oral)   Resp 16   Ht 5\' 7"  (1.702 m)   Wt 211 lb 12.8 oz (96.1 kg)   SpO2 100%   BMI 33.17 kg/m       Objective:   Physical  Exam   General Mental Status- Alert. General Appearance- Not in acute distress.   Skin General: Color- Normal Color. Moisture- Normal Moisture.  Neck Carotid Arteries- Normal color. Moisture- Normal Moisture. No carotid bruits. No JVD.  Chest and Lung Exam Auscultation: Breath Sounds:-Normal.  Cardiovascular Auscultation:Rythm- Regular. Murmurs & Other Heart Sounds:Auscultation of the heart reveals- No Murmurs.  Abdomen Inspection:-Inspeection Normal. Palpation/Percussion:Note:No mass. Palpation and Percussion of the abdomen reveal- Non Tender, Non Distended + BS, no rebound or guarding.    Neurologic Cranial Nerve exam:- CN III-XII intact(No nystagmus), symmetric smile. Strength:- 5/5 equal and symmetric strength both upper and lower extremities.     Assessment & Plan:  Your blood pressure is elevated today but you are not on your diuretic recently.  I did go ahead and refill both the metoprolol and HCTZ.  Please start taking both daily as we discussed.  I want you start checking your blood pressure daily for the next 5 to 7 days and give [place] update on those readings.  I want to see blood pressure reading less than 140/90 consistently.  If not then we need to adjust BP medication regimen.  You  mentioned some calf crampingin calfs at times.  I did go ahead and place future metabolic panel and magnesium lab order.  We will see if electrolyte abnormality causing cramping.  With HCTZ we usually recommend eating potassium rich foods to prevent low potassium level.  We discussed your recent heavy menstrual cycles.  I reviewed your last gynecologist visit note.  She had recommended taking lysteda 2 tablets at 3 times daily during menstrual cycle.  But you expressed that it is too frequent and you would often forget dosing.  So you could try 2 tablets twice daily instead.  Please get labs scheduled within a week.  Follow-up date to be determined depending on lab review and 1 week of  blood pressure readings.  The recommend that you get complete physical exam/wellness exam in the spring as well.  [name], PA-C

## 2018-09-03 ENCOUNTER — Ambulatory Visit: Payer: BC Managed Care – PPO | Admitting: Medical

## 2018-09-03 ENCOUNTER — Encounter: Payer: Self-pay | Admitting: Medical

## 2018-09-03 VITALS — BP 140/90 | HR 58 | Temp 98.2°F | Resp 16 | Ht 67.0 in | Wt 195.6 lb

## 2018-09-03 DIAGNOSIS — N390 Urinary tract infection, site not specified: Secondary | ICD-10-CM

## 2018-09-03 DIAGNOSIS — I10 Essential (primary) hypertension: Secondary | ICD-10-CM | POA: Diagnosis not present

## 2018-09-03 LAB — POC URINALSYSI DIPSTICK (AUTOMATED)
Bilirubin, UA: NEGATIVE
Blood, UA: NEGATIVE
Glucose, UA: NEGATIVE
Ketones, UA: NEGATIVE
Nitrite, UA: NEGATIVE
PROTEIN UA: NEGATIVE
Spec Grav, UA: 1.01 (ref 1.010–1.025)
Urobilinogen, UA: NEGATIVE E.U./dL — AB
pH, UA: 6 (ref 5.0–8.0)

## 2018-09-03 MED ORDER — NITROFURANTOIN MONOHYD MACRO 100 MG PO CAPS: 100.0000 mg | ORAL_CAPSULE | Freq: Two times a day (BID) | ORAL | 0 refills | Status: DC

## 2018-09-03 NOTE — Patient Instructions (Addendum)
You appear to have a urinary tract infection. I am prescribing macrobid antibiotic for the probable infection. Hydrate well. I am sending out a urine culture. During the interim if your signs and symptoms worsen rather than improving please notify us. We will notify your when the culture results are back.  Your bp was hight initially but then did come down on recheck. Continue current bp meds. Recommend checking bp 3-4 times a week when relaxed at home. Confirm less than 140/90.   Follow up in 7 days or as needed.(Also recommend around march or April come in for bp recheck and schedule early morning so fasting labs can be done.)

## 2018-09-03 NOTE — Progress Notes (Signed)
Subjective:    Patient ID: Nicole Ryan, female    DOB: 10-Jan-1976, 43 y.o.   MRN: [id number]  HPI  Pt in today reporting urinary symptoms for about one week.  Dysuria- yes. Frequent urination-yes Hesitancy-no Suprapubic pressure-yes at beginning. Less now. Fever-no chills-no Nauseano- Vomiting-no CVA pain-no History of UTI-rare. Many years since last. Gross hematuria-no. Slight odor to to urine.  lmp- around Aug 13, 2018.    Review of Systems  Constitutional: Negative for chills, fatigue and fever.  Respiratory: Negative for cough, chest tightness, shortness of breath and wheezing.   Cardiovascular: Negative for chest pain and palpitations.  Gastrointestinal: Negative for abdominal pain.  Genitourinary: Positive for dysuria and frequency. Negative for difficulty urinating, flank pain, urgency and vaginal pain.  Musculoskeletal: Negative for back pain.  Skin: Negative for rash.  Neurological: Negative for dizziness and headaches.  Hematological: Negative for adenopathy. Does not bruise/bleed easily.  Psychiatric/Behavioral: Negative for behavioral problems and confusion.   Past Medical History:  Diagnosis Date  . Allergy   . GERD (gastroesophageal reflux disease)    heart burn. 2-3 times a week.  . Hypertension      Social History   Socioeconomic History  . Marital status: Single    Spouse name: Not on file  . Number of children: Not on file  . Years of education: Not on file  . Highest education level: Not on file  Occupational History  . Occupation: Control and instrumentation engineer  Social Needs  . Financial resource strain: Not on file  . Food insecurity:    Worry: Not on file    Inability: Not on file  . Transportation needs:    Medical: Not on file    Non-medical: Not on file  Tobacco Use  . Smoking status: Never Smoker  . Smokeless tobacco: Never Used  Substance and Sexual Activity  . Alcohol use: Not Currently    Frequency: Never  . Drug use: Never  .  Sexual activity: Not Currently    Birth control/protection: None  Lifestyle  . Physical activity:    Days per week: Not on file    Minutes per session: Not on file  . Stress: Not on file  Relationships  . Social connections:    Talks on phone: Not on file    Gets together: Not on file    Attends religious service: Not on file    Active member of club or organization: Not on file    Attends meetings of clubs or organizations: Not on file    Relationship status: Not on file  . Intimate partner violence:    Fear of current or ex partner: Not on file    Emotionally abused: Not on file    Physically abused: Not on file    Forced sexual activity: Not on file  Other Topics Concern  . Not on file  Social History Narrative  . Not on file    Past Surgical History:  Procedure Laterality Date  . BREAST BIOPSY Bilateral   . breast surg Bilateral    Beign tumors both breast    Family History  Problem Relation Age of Onset  . Aneurysm Mother   . Hypertension Mother   . Diabetes Mother     Not on File  Current Outpatient Medications on File Prior to Visit  Medication Sig Dispense Refill  . fluticasone (FLONASE) 50 MCG/ACT nasal spray Place 2 sprays into both nostrils daily. 16 g 11  . hydrochlorothiazide (MICROZIDE) 12.5 MG capsule  TAKE 1 CAPSULE BY MOUTH EVERY DAY 30 capsule 3  . levocetirizine (XYZAL) 5 MG tablet Take 1 tablet (5 mg total) by mouth every evening. 90 tablet 3  . metoprolol succinate (TOPROL-XL) 50 MG 24 hr tablet Take 1 tablet (50 mg total) by mouth daily. Take with or immediately following a meal. 30 tablet 2  . montelukast (SINGULAIR) 10 MG tablet Take 1 tablet (10 mg total) by mouth at bedtime. 90 tablet 3   No current facility-administered medications on file prior to visit.     BP (!) 160/55   Pulse (!) 58   Temp 98.2 F (36.8 C) (Oral)   Resp 16   Ht 5\' 7"  (1.702 m)   Wt 195 lb 9.6 oz (88.7 kg)   SpO2 100%   BMI 30.64 kg/m       Objective:    Physical Exam  General Appearance- Not in acute distress.  HEENT Eyes- Scleraeral/Conjuntiva-bilat- Not Yellow. Mouth & Throat- Normal.  Chest and Lung Exam Auscultation: Breath sounds:-Normal. Adventitious sounds:- No Adventitious sounds.  Cardiovascular Auscultation:Rythm - Regular. Heart Sounds -Normal heart sounds.  Abdomen Inspection:-Inspection Normal.  Palpation/Perucssion: Palpation and Percussion of the abdomen reveal- non Tender, No Rebound tenderness, No rigidity(Guarding) and No Palpable abdominal masses.  Liver:-Normal.  Spleen:- Normal.   Back- no cva tenderness.      Assessment & Plan:  You appear to have a urinary tract infection. I am prescribing macrobid antibiotic for the probable infection. Hydrate well. I am sending out a urine culture. During the interim if your signs and symptoms worsen rather than improving please notify us. We will notify your when the culture results are back.  Your bp was hight initially but then did come down on recheck. Continue current bp meds. Recommend checking bp 3-4 times a week when relaxed at home. Confirm less than 140/90.   Follow up in 7 days or as needed.(Also recommend around march or April come in for bp recheck and schedule early morning so fasting labs can be done.)  [employer], PA-C

## 2018-09-05 LAB — URINE CULTURE
MICRO NUMBER:: 59272
SPECIMEN QUALITY:: ADEQUATE

## 2018-09-22 ENCOUNTER — Other Ambulatory Visit (INDEPENDENT_AMBULATORY_CARE_PROVIDER_SITE_OTHER): Payer: BC Managed Care – PPO

## 2018-09-22 DIAGNOSIS — R252 Cramp and spasm: Secondary | ICD-10-CM

## 2018-09-22 DIAGNOSIS — I10 Essential (primary) hypertension: Secondary | ICD-10-CM | POA: Diagnosis not present

## 2018-09-22 LAB — COMPREHENSIVE METABOLIC PANEL
ALT: 20 U/L (ref 0–35)
AST: 23 U/L (ref 0–37)
Albumin: 4 g/dL (ref 3.5–5.2)
Alkaline Phosphatase: 86 U/L (ref 39–117)
BUN: 9 mg/dL (ref 6–23)
CO2: 22 mEq/L (ref 19–32)
Calcium: 9.6 mg/dL (ref 8.4–10.5)
Chloride: 103 mEq/L (ref 96–112)
Creatinine, Ser: 0.73 mg/dL (ref 0.40–1.20)
GFR: 106.38 mL/min (ref >60.00)
Glucose, Bld: 99 mg/dL (ref 70–99)
Potassium: 4 mEq/L (ref 3.5–5.1)
Sodium: 136 mEq/L (ref 135–145)
Total Bilirubin: 0.3 mg/dL (ref 0.2–1.2)
Total Protein: 7.2 g/dL (ref 6.0–8.3)

## 2018-09-22 LAB — LIPID PANEL
Cholesterol: 160 mg/dL (ref 0–200)
HDL: 60.7 mg/dL (ref >39.00)
LDL Cholesterol: 77 mg/dL (ref 0–99)
NonHDL: 99.28
Total CHOL/HDL Ratio: 3
Triglycerides: 109 mg/dL (ref 0.0–149.0)
VLDL: 21.8 mg/dL (ref 0.0–40.0)

## 2018-09-22 LAB — MAGNESIUM: Magnesium: 1.9 mg/dL (ref 1.5–2.5)

## 2018-11-02 ENCOUNTER — Other Ambulatory Visit: Payer: Self-pay | Admitting: Medical

## 2019-01-05 ENCOUNTER — Other Ambulatory Visit: Payer: Self-pay | Admitting: Medical

## 2019-01-30 ENCOUNTER — Other Ambulatory Visit: Payer: Self-pay | Admitting: Medical

## 2019-01-30 DIAGNOSIS — R5381 Other malaise: Secondary | ICD-10-CM

## 2019-02-02 ENCOUNTER — Other Ambulatory Visit: Payer: Self-pay

## 2019-02-02 ENCOUNTER — Encounter: Payer: Self-pay | Admitting: Medical

## 2019-02-02 ENCOUNTER — Ambulatory Visit: Payer: BC Managed Care – PPO | Admitting: Medical

## 2019-02-02 NOTE — Progress Notes (Signed)
   Subjective:    Patient ID: Margert Edsall, female    DOB: 11/22/1975, 43 y.o.   MRN: [id number]  HPI Medical assistant jasmine sent virtual invite to pt. I called pt number and not answer. So did not see pt. Will do no charge erroneus visit. Get Jasmine to call and offer pt appointment again.   Review of Systems     Objective:   Physical Exam        Assessment & Plan:

## 2019-02-03 ENCOUNTER — Ambulatory Visit (INDEPENDENT_AMBULATORY_CARE_PROVIDER_SITE_OTHER): Payer: BC Managed Care – PPO | Admitting: Medical

## 2019-02-03 ENCOUNTER — Encounter: Payer: Self-pay | Admitting: Medical

## 2019-02-03 VITALS — BP 141/74 | HR 75 | Ht 67.0 in | Wt 205.0 lb

## 2019-02-03 DIAGNOSIS — J301 Allergic rhinitis due to pollen: Secondary | ICD-10-CM

## 2019-02-03 DIAGNOSIS — I10 Essential (primary) hypertension: Secondary | ICD-10-CM | POA: Diagnosis not present

## 2019-02-03 MED ORDER — METOPROLOL SUCCINATE ER 50 MG PO TB24: 50.0000 mg | ORAL_TABLET | Freq: Every day | ORAL | 2 refills | Status: DC

## 2019-02-03 MED ORDER — HYDROCHLOROTHIAZIDE 12.5 MG PO CAPS: ORAL_CAPSULE | ORAL | 3 refills | Status: DC

## 2019-02-03 MED ORDER — MONTELUKAST SODIUM 10 MG PO TABS: 10.0000 mg | ORAL_TABLET | Freq: Every day | ORAL | 3 refills | Status: DC

## 2019-02-03 MED ORDER — LEVOCETIRIZINE DIHYDROCHLORIDE 5 MG PO TABS: 5.0000 mg | ORAL_TABLET | Freq: Every evening | ORAL | 3 refills | Status: DC

## 2019-02-03 MED ORDER — METOPROLOL SUCCINATE ER 50 MG PO TB24: 50.0000 mg | ORAL_TABLET | Freq: Every day | ORAL | 1 refills | Status: DC

## 2019-02-03 MED ORDER — HYDROCHLOROTHIAZIDE 12.5 MG PO CAPS: ORAL_CAPSULE | ORAL | 1 refills | Status: DC

## 2019-02-03 MED ORDER — LOSARTAN POTASSIUM 25 MG PO TABS: 25.0000 mg | ORAL_TABLET | Freq: Every day | ORAL | 0 refills | Status: DC

## 2019-02-03 MED ORDER — FLUTICASONE PROPIONATE 50 MCG/ACT NA SUSP: NASAL | 11 refills | Status: DC

## 2019-02-03 NOTE — Patient Instructions (Signed)
Your blood pressure is borderline high today on second check.  I do want you to continue with metoprolol and HCTZ.  Want to check your blood pressure daily over the next week and if your blood pressures are not consistently less than 140/90 then and low dose losartan which I sent to your pharmacy.  For allergic rhinitis, did refill your Zyrtec, montelukast and Flonase.  Follow-up in 2weeks virtual visit.  I want to confirm your blood pressure is less than 140/90 and see if he had start the losartan.

## 2019-02-03 NOTE — Progress Notes (Signed)
Subjective:    Patient ID: Nicole Ryan, female    DOB: [birth year], 43 y.o.   MRN: [id number]  HPI  Virtual Visit via Video Note  I connected with [name] on 02/03/19 at 11:00 AM EDT by a video enabled telemedicine application and verified that I am speaking with the correct person using two identifiers.  Location: Patient: home Provider: home    I discussed the limitations of evaluation and management by telemedicine and the availability of in person appointments. The patient expressed understanding and agreed to proceed.  History of Present Illness:  Pt states she needs refill of all her meds. Allergy meds and htn meds.   Pt is on metroprolol and hctz. No cardiac or neurologic signs or symptoms. Bp high on first check then borderline high on second check. No bp check for months except for today check.  Pt also has allergies. She has them year round. Mild itching throat with recent weather change. But no fever, cough, sinus pressure or chest congestion. Pt thinks mild flare of allergies.     Observations/Objective: General-no acute distress, pleasant, oriented. Lungs- on inspection lungs appear unlabored. Neck- no tracheal deviation or jvd on inspection. Neuro- gross motor function appears intact. heent- no sinus pressure. Pt reports some pnd. Some watery eyes at night.   Assessment and Plan: Your blood pressure is borderline high today on second check.  I do want you to continue with metoprolol and HCTZ.  Want to check your blood pressure daily over the next week and if your blood pressures are not consistently less than 140/90 then and low dose losartan which I sent to your pharmacy.  For allergic rhinitis, did refill your Zyrtec, montelukast and Flonase.  Follow-up in 2weeks virtual visit.  I want to confirm your blood pressure is less than 140/90 and see if he had start the losartan.  [name], PA-C  Follow Up Instructions:    I discussed the  assessment and treatment plan with the patient. The patient was provided an opportunity to ask questions and all were answered. The patient agreed with the plan and demonstrated an understanding of the instructions.   The patient was advised to call back or seek an in-person evaluation if the symptoms worsen or if the condition fails to improve as anticipated.  I provided 15 minutes of non-face-to-face time during this encounter.   [name], PA-C    Review of Systems  Constitutional: Negative for chills, fatigue and fever.  HENT: Positive for postnasal drip. Negative for congestion, ear pain, sinus pressure, sore throat and voice change.        Faint itch to throat.  Eyes: Negative for pain, discharge, redness and itching.       Watery eyes. No dc.  Respiratory: Negative for cough, chest tightness, shortness of breath and wheezing.   Cardiovascular: Negative for chest pain and palpitations.  Gastrointestinal: Negative for abdominal pain.  Skin: Negative for rash.  Neurological: Negative for dizziness, seizures, syncope, weakness, numbness and headaches.  Hematological: Negative for adenopathy. Does not bruise/bleed easily.  Psychiatric/Behavioral: Negative for behavioral problems and confusion.    Past Medical History:  Diagnosis Date  . Allergy   . GERD (gastroesophageal reflux disease)    heart burn. 2-3 times a week.  . Hypertension      Social History   Socioeconomic History  . Marital status: Single    Spouse name: Not on file  . Number of children: Not on file  . Years  of education: Not on file  . Highest education level: Not on file  Occupational History  . Occupation: Control and instrumentation engineer  Social Needs  . Financial resource strain: Not on file  . Food insecurity    Worry: Not on file    Inability: Not on file  . Transportation needs    Medical: Not on file    Non-medical: Not on file  Tobacco Use  . Smoking status: Never Smoker  . Smokeless tobacco:  Never Used  Substance and Sexual Activity  . Alcohol use: Not Currently    Frequency: Never  . Drug use: Never  . Sexual activity: Not Currently    Birth control/protection: None  Lifestyle  . Physical activity    Days per week: Not on file    Minutes per session: Not on file  . Stress: Not on file  Relationships  . Social Herbalist on phone: Not on file    Gets together: Not on file    Attends religious service: Not on file    Active member of club or organization: Not on file    Attends meetings of clubs or organizations: Not on file    Relationship status: Not on file  . Intimate partner violence    Fear of current or ex partner: Not on file    Emotionally abused: Not on file    Physically abused: Not on file    Forced sexual activity: Not on file  Other Topics Concern  . Not on file  Social History Narrative  . Not on file    Past Surgical History:  Procedure Laterality Date  . BREAST BIOPSY Bilateral   . breast surg Bilateral    Beign tumors both breast    Family History  Problem Relation Age of Onset  . Aneurysm Mother   . Hypertension Mother   . Diabetes Mother     Not on File  Current Outpatient Medications on File Prior to Visit  Medication Sig Dispense Refill  . fluticasone (FLONASE) 50 MCG/ACT nasal spray SPRAY 2 SPRAYS INTO EACH NOSTRIL EVERY DAY 16 g 1  . levocetirizine (XYZAL) 5 MG tablet Take 1 tablet (5 mg total) by mouth every evening. 90 tablet 3  . montelukast (SINGULAIR) 10 MG tablet Take 1 tablet (10 mg total) by mouth at bedtime. 90 tablet 3  . nitrofurantoin, macrocrystal-monohydrate, (MACROBID) 100 MG capsule Take 1 capsule (100 mg total) by mouth 2 (two) times daily. 14 capsule 0   No current facility-administered medications on file prior to visit.     There were no vitals taken for this visit.      Objective:   Physical Exam        Assessment & Plan:

## 2019-02-11 ENCOUNTER — Other Ambulatory Visit (INDEPENDENT_AMBULATORY_CARE_PROVIDER_SITE_OTHER): Payer: BC Managed Care – PPO

## 2019-02-11 ENCOUNTER — Other Ambulatory Visit: Payer: Self-pay

## 2019-02-11 DIAGNOSIS — I10 Essential (primary) hypertension: Secondary | ICD-10-CM

## 2019-02-11 LAB — LIPID PANEL
Cholesterol: 147 mg/dL (ref 0–200)
HDL: 57.2 mg/dL (ref >39.00)
LDL Cholesterol: 70 mg/dL (ref 0–99)
NonHDL: 89.71
Total CHOL/HDL Ratio: 3
Triglycerides: 97 mg/dL (ref 0.0–149.0)
VLDL: 19.4 mg/dL (ref 0.0–40.0)

## 2019-02-11 LAB — COMPREHENSIVE METABOLIC PANEL
ALT: 28 U/L (ref 0–35)
AST: 37 U/L (ref 0–37)
Albumin: 3.8 g/dL (ref 3.5–5.2)
Alkaline Phosphatase: 83 U/L (ref 39–117)
BUN: 10 mg/dL (ref 6–23)
CO2: 24 mEq/L (ref 19–32)
Calcium: 9.2 mg/dL (ref 8.4–10.5)
Chloride: 103 mEq/L (ref 96–112)
Creatinine, Ser: 0.72 mg/dL (ref 0.40–1.20)
GFR: 107.88 mL/min (ref >60.00)
Glucose, Bld: 93 mg/dL (ref 70–99)
Potassium: 4.2 mEq/L (ref 3.5–5.1)
Sodium: 136 mEq/L (ref 135–145)
Total Bilirubin: 0.4 mg/dL (ref 0.2–1.2)
Total Protein: 6.6 g/dL (ref 6.0–8.3)

## 2019-02-17 ENCOUNTER — Telehealth: Payer: Self-pay | Admitting: Medical

## 2019-02-17 ENCOUNTER — Other Ambulatory Visit: Payer: Self-pay

## 2019-02-17 ENCOUNTER — Encounter: Payer: Self-pay | Admitting: Medical

## 2019-02-17 ENCOUNTER — Ambulatory Visit (INDEPENDENT_AMBULATORY_CARE_PROVIDER_SITE_OTHER): Payer: BC Managed Care – PPO | Admitting: Medical

## 2019-02-17 VITALS — BP 140/86

## 2019-02-17 DIAGNOSIS — N644 Mastodynia: Secondary | ICD-10-CM | POA: Diagnosis not present

## 2019-02-17 NOTE — Telephone Encounter (Signed)
Pt sent my chart message and wants to see Ugonna Anyanwu.gyn md at [facility] [place].  I already put in the referral. Thanks.  Would you send message back to me confirming you got this message.

## 2019-02-17 NOTE — Progress Notes (Signed)
   Subjective:    Patient ID: Nicole Ryan, female    DOB: 05-14-1976, 43 y.o.   MRN: [id number]  HPI  Virtual Visit via Video Note  I connected with Nicole Ryan on 02/17/19 at 11:00 AM EDT by a video enabled telemedicine application and verified that I am speaking with the correct person using two identifiers.  Location: Patient: home Provider: home   I discussed the limitations of evaluation and management by telemedicine and the availability of in person appointments. The patient expressed understanding and agreed to proceed.  History of Present Illness:  Today her bp is 140/86. One day this week 118/72, 148/86, 141/79, 125/73 and today 140/86. Pt had borderline reading on last visit and wanted to confirm that she had some lower readings.  Pt states allergies are controlled.  Pt states she also needs referral to gyn for recent breast tenderness.Pt rt side has been sore. No dc. Tender at bra line. Some pain in breast lifting. Pt had normal mammogram. Pt has gynecologist.  Pt last menses about 10 days ago. Pt states this sore area started before menses. She drinks occasional coffee and soda.    Observations/Objective: General-no acute distress, pleasant, oriented. Lungs- on inspection lungs appear unlabored. Neck- no tracheal deviation or jvd on inspection. Neuro- gross motor function appears intact.   Assessment and Plan: Bp is well controlled over past week. Continue on current bp med regimen.  For breast pain that is mild will refer pt to gyn for exam in order to determine treatment and to see if needs mammogram screening vs diagnostic. Advised no caffeine during interim to avoid potential fibrocystic changes.  Follow up 6 month regular follow up or as needed  Follow Up Instructions:    I discussed the assessment and treatment plan with the patient. The patient was provided an opportunity to ask questions and all were answered. The patient agreed with the plan and  demonstrated an understanding of the instructions.   The patient was advised to call back or seek an in-person evaluation if the symptoms worsen or if the condition fails to improve as anticipated.  I provided 15  minutes of non-face-to-face time during this encounter.   [name], PA-C   Review of Systems     Objective:   Physical Exam        Assessment & Plan:

## 2019-02-17 NOTE — Patient Instructions (Signed)
Bp is well controlled over past week. Continue on current bp med regimen.  For breast pain that is mild will refer pt to gyn for exam in order to determine treatment and to see if needs mammogram screening vs diagnostic. Advised no caffeine during interim to avoid potential fibrocystic changes.  Follow up 6 month regular follow up or as needed

## 2019-03-02 ENCOUNTER — Other Ambulatory Visit: Payer: Self-pay | Admitting: *Deleted

## 2019-03-02 DIAGNOSIS — Z20822 Contact with and (suspected) exposure to covid-19: Secondary | ICD-10-CM

## 2019-03-06 LAB — NOVEL CORONAVIRUS, NAA: SARS-CoV-2, NAA: NOT DETECTED

## 2019-03-09 ENCOUNTER — Ambulatory Visit: Payer: BC Managed Care – PPO | Admitting: Medical

## 2019-03-09 ENCOUNTER — Ambulatory Visit: Payer: Self-pay | Admitting: Medical

## 2019-03-09 ENCOUNTER — Encounter: Payer: Self-pay | Admitting: Medical

## 2019-03-09 ENCOUNTER — Other Ambulatory Visit: Payer: Self-pay

## 2019-03-09 VITALS — BP 150/84 | HR 75 | Temp 98.5°F | Resp 16 | Ht 67.0 in | Wt 205.4 lb

## 2019-03-09 DIAGNOSIS — K047 Periapical abscess without sinus: Secondary | ICD-10-CM

## 2019-03-09 MED ORDER — AMOXICILLIN-POT CLAVULANATE 875-125 MG PO TABS: 1.0000 | ORAL_TABLET | Freq: Two times a day (BID) | ORAL | 0 refills | Status: DC

## 2019-03-09 MED ORDER — PREDNISONE 10 MG PO TABS: ORAL_TABLET | ORAL | 0 refills | Status: DC

## 2019-03-09 NOTE — Patient Instructions (Signed)
Your recent right side of face swelling and tenderness with right side submandibular node swelling likely represents tooth infection rather than allergy to pork.  Allergies are typically symmetric and with some significant itching or lip swelling.  We will treat with Augmentin antibiotic but also gave you low-dose 4-day taper dose of prednisone.  Prednisone can help with swelling of soft tissue and cover for allergies though I am not convinced that this is the case presently.  Going forward for general health purposes I would not eat pork.  You do eat pork again had any symptoms are recurrent then would refer you to allergist.  If any severe symptoms in the future with pork then recommend emergency department evaluation.  Would recommend that she go ahead and establish care with a new dentist in the area.  Would recommend trying dental work also Warden/[phone]  Follow-up in 7 days or as needed.

## 2019-03-09 NOTE — Progress Notes (Signed)
Subjective:    Patient ID: Nicole Ryan, female    DOB: 03/15/1976, 43 y.o.   MRN: [id number]  HPI  Pt in with some swelling to her face. Swelling came up on Saturday evening after eating pulled pork. About an hour after noticed mild swelling. But yesterday morning when she woke noticed rt upper face over rt maxillary region was swollen. Pt states issues with tooth few months ago rt upper side of mouth in march. Pt saw dentist and gave medicine for tooth pain. Pt not sure if was given antibiotic. Around gum line recently burning sensation. Yesterday morning her lips were not swollen. No problems swallowing. No sob or wheezing.  Pt states eats pork infrequently.  Also some mild gum area above the tooth.   Review of Systems  Constitutional: Negative for chills, fatigue and fever.  HENT:       See hpi.  Respiratory: Negative for cough, chest tightness, shortness of breath and wheezing.   Cardiovascular: Negative for chest pain and palpitations.  Gastrointestinal: Negative for abdominal pain.  Musculoskeletal: Negative for back pain, joint swelling and neck pain.  Skin: Negative for rash.  Neurological: Negative for dizziness and headaches.  Hematological: Negative for adenopathy. Does not bruise/bleed easily.  Psychiatric/Behavioral: Negative for confusion, hallucinations and sleep disturbance. The patient is not nervous/anxious.     Past Medical History:  Diagnosis Date  . Allergy   . GERD (gastroesophageal reflux disease)    heart burn. 2-3 times a week.  . Hypertension      Social History   Socioeconomic History  . Marital status: Single    Spouse name: Not on file  . Number of children: Not on file  . Years of education: Not on file  . Highest education level: Not on file  Occupational History  . Occupation: Control and instrumentation engineer  Social Needs  . Financial resource strain: Not on file  . Food insecurity    Worry: Not on file    Inability: Not on file  .  Transportation needs    Medical: Not on file    Non-medical: Not on file  Tobacco Use  . Smoking status: Never Smoker  . Smokeless tobacco: Never Used  Substance and Sexual Activity  . Alcohol use: Not Currently    Frequency: Never  . Drug use: Never  . Sexual activity: Not Currently    Birth control/protection: None  Lifestyle  . Physical activity    Days per week: Not on file    Minutes per session: Not on file  . Stress: Not on file  Relationships  . Social Herbalist on phone: Not on file    Gets together: Not on file    Attends religious service: Not on file    Active member of club or organization: Not on file    Attends meetings of clubs or organizations: Not on file    Relationship status: Not on file  . Intimate partner violence    Fear of current or ex partner: Not on file    Emotionally abused: Not on file    Physically abused: Not on file    Forced sexual activity: Not on file  Other Topics Concern  . Not on file  Social History Narrative  . Not on file    Past Surgical History:  Procedure Laterality Date  . BREAST BIOPSY Bilateral   . breast surg Bilateral    Beign tumors both breast    Family History  Problem  Relation Age of Onset  . Aneurysm Mother   . Hypertension Mother   . Diabetes Mother     Not on File  Current Outpatient Medications on File Prior to Visit  Medication Sig Dispense Refill  . fluticasone (FLONASE) 50 MCG/ACT nasal spray SPRAY 2 SPRAYS INTO EACH NOSTRIL EVERY DAY 16 g 11  . hydrochlorothiazide (MICROZIDE) 12.5 MG capsule TAKE 1 CAPSULE BY MOUTH EVERY DAY 90 capsule 1  . levocetirizine (XYZAL) 5 MG tablet Take 1 tablet (5 mg total) by mouth every evening. 90 tablet 3  . losartan (COZAAR) 25 MG tablet Take 1 tablet (25 mg total) by mouth daily. 30 tablet 0  . metoprolol succinate (TOPROL-XL) 50 MG 24 hr tablet Take 1 tablet (50 mg total) by mouth daily. Take with or immediately following a meal. 90 tablet 1  .  montelukast (SINGULAIR) 10 MG tablet Take 1 tablet (10 mg total) by mouth at bedtime. 90 tablet 3  . nitrofurantoin, macrocrystal-monohydrate, (MACROBID) 100 MG capsule Take 1 capsule (100 mg total) by mouth 2 (two) times daily. 14 capsule 0   No current facility-administered medications on file prior to visit.     BP (!) 150/84   Pulse 75   Temp 98.5 F (36.9 C) (Oral)   Resp 16   Ht 5\' 7"  (1.702 m)   Wt 205 lb 6.4 oz (93.2 kg)   SpO2 100%   BMI 32.17 kg/m       Objective:   Physical Exam  General- No acute distress. Pleasant patient. Neck- Full range of motion, no jvd. Rt submandibular node mild enlarged and tender.  Lungs- Clear, even and unlabored. Heart- regular rate and rhythm. Neurologic- CNII- XII grossly intact.  heent- negative but rt side cheek/maxillary area mild puffy, area just above reported gum irritation mild indurated and tender. No warmth.         Assessment & Plan:  Your recent right side of face swelling and tenderness with right side submandibular node swelling likely represents tooth infection rather than allergy to pork.  Allergies are typically symmetric and with some significant itching or lip swelling.  We will treat with Augmentin antibiotic but also gave you low-dose 4-day taper dose of prednisone.  Prednisone can help with swelling of soft tissue and cover for allergies though I am not convinced that this is the case presently.  Going forward for general health purposes I would not eat pork.  You do eat pork again had any symptoms are recurrent then would refer you to allergist.  If any severe symptoms in the future with pork then recommend emergency department evaluation.  Would recommend that she go ahead and establish care with a new dentist in the area.  Would recommend trying dental work also Warden/[phone]  Follow-up in 7 days or as needed.  25 minutes spent with pt today. 50% of time spent on couseling on dx and tx of  tooth infection. But also counseled on differential dx and tx plan in event any obvious type allergy signs/symptoms occur in future.  [name], PA-C

## 2019-03-09 NOTE — Telephone Encounter (Signed)
Pt. Reports she ate pork Saturday evening, "which I usually don't eat." Had immediate "tingling in my mouth and my right cheek is swollen." Denies any difficulty swallowing or difficulty breathing. Swelling goes to beneath her eye and involves her nose.No itching and mild discomfort. Warm transfer to [facility] in the practice.  Answer Assessment - Initial Assessment Questions 1. ONSET: "When did the swelling start?" (e.g., minutes, hours, days)     Saturday evening 2. LOCATION: "What part of the face is swollen?"     Right cheek 3. SEVERITY: "How swollen is it?"     Goes up under eye and nose 4. ITCHING: "Is there any itching?" If so, ask: "How much?"   (Scale 1-10; mild, moderate or severe)     Gums itch 5. PAIN: "Is the swelling painful to touch?" If so, ask: "How painful is it?"   (Scale 1-10; mild, moderate or severe)     Mild 6. FEVER: "Do you have a fever?" If so, ask: "What is it, how was it measured, and when did it start?"      No 7. CAUSE: "What do you think is causing the face swelling?"     Ate some pork 8. RECURRENT SYMPTOM: "Have you had face swelling before?" If so, ask: "When was the last time?" "What happened that time?"     No 9. OTHER SYMPTOMS: "Do you have any other symptoms?" (e.g., toothache, leg swelling)     Had some sensitivity a few ago 10. PREGNANCY: "Is there any chance you are pregnant?" "When was your last menstrual period?"       No  Protocols used: [facility]

## 2019-03-09 NOTE — Telephone Encounter (Signed)
Appt scheduled

## 2019-08-07 ENCOUNTER — Encounter: Payer: BC Managed Care – PPO | Admitting: Medical

## 2019-08-19 ENCOUNTER — Ambulatory Visit (INDEPENDENT_AMBULATORY_CARE_PROVIDER_SITE_OTHER): Payer: BC Managed Care – PPO | Admitting: Medical

## 2019-08-19 ENCOUNTER — Other Ambulatory Visit: Payer: Self-pay

## 2019-08-19 VITALS — BP 155/80 | HR 74 | Temp 97.2°F | Resp 18 | Ht 67.0 in | Wt 215.2 lb

## 2019-08-19 DIAGNOSIS — Z0000 Encounter for general adult medical examination without abnormal findings: Secondary | ICD-10-CM | POA: Diagnosis not present

## 2019-08-19 DIAGNOSIS — Z1231 Encounter for screening mammogram for malignant neoplasm of breast: Secondary | ICD-10-CM

## 2019-08-19 LAB — COMPREHENSIVE METABOLIC PANEL
ALT: 32 U/L (ref 0–35)
AST: 51 U/L — ABNORMAL HIGH (ref 0–37)
Albumin: 3.8 g/dL (ref 3.5–5.2)
Alkaline Phosphatase: 78 U/L (ref 39–117)
BUN: 10 mg/dL (ref 6–23)
CO2: 24 mEq/L (ref 19–32)
Calcium: 9.6 mg/dL (ref 8.4–10.5)
Chloride: 106 mEq/L (ref 96–112)
Creatinine, Ser: 0.74 mg/dL (ref 0.40–1.20)
GFR: 104.26 mL/min (ref >60.00)
Glucose, Bld: 126 mg/dL — ABNORMAL HIGH (ref 70–99)
Potassium: 4.3 mEq/L (ref 3.5–5.1)
Sodium: 137 mEq/L (ref 135–145)
Total Bilirubin: 0.4 mg/dL (ref 0.2–1.2)
Total Protein: 6.8 g/dL (ref 6.0–8.3)

## 2019-08-19 LAB — CBC WITH DIFFERENTIAL/PLATELET
Basophils Absolute: 0.4 10*3/uL — ABNORMAL HIGH (ref 0.0–0.1)
Basophils Relative: 5.6 % — ABNORMAL HIGH (ref 0.0–3.0)
Eosinophils Absolute: 0.1 10*3/uL (ref 0.0–0.7)
Eosinophils Relative: 1.4 % (ref 0.0–5.0)
HCT: 32.6 % — ABNORMAL LOW (ref 36.0–46.0)
Hemoglobin: 9.6 g/dL — ABNORMAL LOW (ref 12.0–15.0)
Lymphocytes Relative: 33.2 % (ref 12.0–46.0)
Lymphs Abs: 2.2 10*3/uL (ref 0.7–4.0)
MCHC: 29.5 g/dL — ABNORMAL LOW (ref 30.0–36.0)
MCV: 62.3 fl — ABNORMAL LOW (ref 78.0–100.0)
Monocytes Absolute: 0.7 10*3/uL (ref 0.1–1.0)
Monocytes Relative: 10.8 % (ref 3.0–12.0)
Neutro Abs: 3.3 10*3/uL (ref 1.4–7.7)
Neutrophils Relative %: 49 % (ref 43.0–77.0)
Platelets: 540 10*3/uL — ABNORMAL HIGH (ref 150.0–400.0)
RBC: 5.23 Mil/uL — ABNORMAL HIGH (ref 3.87–5.11)
RDW: 18.3 % — ABNORMAL HIGH (ref 11.5–15.5)
WBC: 6.7 10*3/uL (ref 4.0–10.5)

## 2019-08-19 LAB — LIPID PANEL
Cholesterol: 166 mg/dL (ref 0–200)
HDL: 57.1 mg/dL (ref >39.00)
LDL Cholesterol: 86 mg/dL (ref 0–99)
NonHDL: 108.42
Total CHOL/HDL Ratio: 3
Triglycerides: 111 mg/dL (ref 0.0–149.0)
VLDL: 22.2 mg/dL (ref 0.0–40.0)

## 2019-08-19 MED ORDER — LOSARTAN POTASSIUM 100 MG PO TABS: 100.0000 mg | ORAL_TABLET | Freq: Every day | ORAL | 3 refills | Status: DC

## 2019-08-19 MED ORDER — ALBUTEROL SULFATE HFA 108 (90 BASE) MCG/ACT IN AERS: 2.0000 | INHALATION_SPRAY | Freq: Four times a day (QID) | RESPIRATORY_TRACT | 0 refills | Status: AC | PRN

## 2019-08-19 MED ORDER — ALBUTEROL SULFATE HFA 108 (90 BASE) MCG/ACT IN AERS: 2.0000 | INHALATION_SPRAY | Freq: Four times a day (QID) | RESPIRATORY_TRACT | 0 refills | Status: DC | PRN

## 2019-08-19 NOTE — Progress Notes (Signed)
Subjective:    Patient ID: Nicole Ryan, female    DOB: 04-May-1976, 43 y.o.   MRN: [id number]  HPI   Pt in for wellness exam.  She is fasting.   Pt will get flu vaccine today.  Pt works school system. No getting regular exercise. Moderate healthy diet. Eating less carbs. Non smoker. No alcohol use.  Pt has htn. Her bp is elevated but better than it was in September. She states hctz make her urinate too much.   Pt states flonase helps with her allergies. She thinks xyzal does not help. She never started singulair. States overall allergy symptoms better but still some watery eyes and mild runny nose.  Pt has order for mammogram in epic. She did not get scheduled. Pt had brief breast area pain in summer for about a week then pain went away. Pt feels no lumps or bumps in her breast. She never went to gyn appointment for breast pain.  Pt states last pap smear done one year ago. Said last one was negative.  lmp- presently.    Review of Systems  Constitutional: Negative for chills, fatigue, fever and unexpected weight change.  Respiratory: Negative for cough, chest tightness, shortness of breath and wheezing.        States very rare transient occasional wheeze at night when she is trying to fall asleep.   Cardiovascular: Negative for chest pain and palpitations.  Gastrointestinal: Negative for abdominal pain, constipation and vomiting.  Genitourinary: Negative for difficulty urinating, dysuria, frequency and hematuria.  Musculoskeletal: Negative for back pain, gait problem and neck stiffness.  Skin: Negative for rash.  Neurological: Negative for dizziness, weakness, numbness and headaches.  Hematological: Negative for adenopathy. Does not bruise/bleed easily.  Psychiatric/Behavioral: Negative for agitation, decreased concentration and dysphoric mood.   Past Medical History:  Diagnosis Date  . Allergy   . GERD (gastroesophageal reflux disease)    heart burn. 2-3 times a week.    . Hypertension      Social History   Socioeconomic History  . Marital status: Single    Spouse name: Not on file  . Number of children: Not on file  . Years of education: Not on file  . Highest education level: Not on file  Occupational History  . Occupation: Control and instrumentation engineer  Tobacco Use  . Smoking status: Never Smoker  . Smokeless tobacco: Never Used  Substance and Sexual Activity  . Alcohol use: Not Currently  . Drug use: Never  . Sexual activity: Not Currently    Birth control/protection: None  Other Topics Concern  . Not on file  Social History Narrative  . Not on file   Social Determinants of Health   Financial Resource Strain:   . Difficulty of Paying Living Expenses: Not on file  Food Insecurity:   . Worried About Charity fundraiser in the Last Year: Not on file  . Ran Out of Food in the Last Year: Not on file  Transportation Needs:   . Lack of Transportation (Medical): Not on file  . Lack of Transportation (Non-Medical): Not on file  Physical Activity:   . Days of Exercise per Week: Not on file  . Minutes of Exercise per Session: Not on file  Stress:   . Feeling of Stress : Not on file  Social Connections:   . Frequency of Communication with Friends and Family: Not on file  . Frequency of Social Gatherings with Friends and Family: Not on file  . Attends Religious  Services: Not on file  . Active Member of Clubs or Organizations: Not on file  . Attends Archivist Meetings: Not on file  . Marital Status: Not on file  Intimate Partner Violence:   . Fear of Current or Ex-Partner: Not on file  . Emotionally Abused: Not on file  . Physically Abused: Not on file  . Sexually Abused: Not on file    Past Surgical History:  Procedure Laterality Date  . BREAST BIOPSY Bilateral   . breast surg Bilateral    Beign tumors both breast    Family History  Problem Relation Age of Onset  . Aneurysm Mother   . Hypertension Mother   . Diabetes Mother      Not on File  Current Outpatient Medications on File Prior to Visit  Medication Sig Dispense Refill  . amoxicillin-clavulanate (AUGMENTIN) 875-125 MG tablet Take 1 tablet by mouth 2 (two) times daily. 20 tablet 0  . fluticasone (FLONASE) 50 MCG/ACT nasal spray SPRAY 2 SPRAYS INTO EACH NOSTRIL EVERY DAY 16 g 11  . hydrochlorothiazide (MICROZIDE) 12.5 MG capsule TAKE 1 CAPSULE BY MOUTH EVERY DAY 90 capsule 1  . levocetirizine (XYZAL) 5 MG tablet Take 1 tablet (5 mg total) by mouth every evening. 90 tablet 3  . losartan (COZAAR) 25 MG tablet Take 1 tablet (25 mg total) by mouth daily. 30 tablet 0  . metoprolol succinate (TOPROL-XL) 50 MG 24 hr tablet Take 1 tablet (50 mg total) by mouth daily. Take with or immediately following a meal. 90 tablet 1  . montelukast (SINGULAIR) 10 MG tablet Take 1 tablet (10 mg total) by mouth at bedtime. 90 tablet 3  . nitrofurantoin, macrocrystal-monohydrate, (MACROBID) 100 MG capsule Take 1 capsule (100 mg total) by mouth 2 (two) times daily. 14 capsule 0   No current facility-administered medications on file prior to visit.    BP (!) 155/80 (BP Location: Left Arm, Patient Position: Sitting, Cuff Size: Normal)   Pulse 74   Temp (!) 97.2 F (36.2 C) (Temporal)   Resp 18   Ht 5\' 7"  (1.702 m)   Wt 215 lb 3.2 oz (97.6 kg)   SpO2 100%   BMI 33.71 kg/m       Objective:   Physical Exam  General Mental Status- Alert. General Appearance- Not in acute distress.   Skin General: Color- Normal Color. Moisture- Normal Moisture.  Neck Carotid Arteries- Normal color. Moisture- Normal Moisture. No carotid bruits. No JVD.  Chest and Lung Exam Auscultation: Breath Sounds:-Normal.  Cardiovascular Auscultation:Rythm- Regular. Murmurs & Other Heart Sounds:Auscultation of the heart reveals- No Murmurs.  Abdomen Inspection:-Inspeection Normal. Palpation/Percussion:Note:No mass. Palpation and Percussion of the abdomen reveal- Non Tender, Non Distended +  BS, no rebound or guarding.   Neurologic Cranial Nerve exam:- CN III-XII intact(No nystagmus), symmetric smile. Strength:- 5/5 equal and symmetric strength both upper and lower extremities.      Assessment & Plan:  For you wellness exam today I have ordered cbc, cmp and lipid panel.  Vaccine given today flu vaccine.  Recommend exercise and healthy diet.  We will let you know lab results as they come in.  Follow up date appointment will be determined after lab review.   For htn will increase losartan to 100 mg daily. Stop hctz. Continue b-blocker.   For allergies, continue flonase and start singulair. Can stop xyzal.  Mammogram order placed again. Go down stairs to get scheduled.  For your rare occasional wheeze at night. When trying to  fall asleep can try albuterol inhaler and see if that resolve completely. Give me update after use.  Follow up one month or as needed.  99213 as addressed htn and allergies today. Also discussed former breast pain which is now resolved.  [name], PA-C

## 2019-08-19 NOTE — Patient Instructions (Addendum)
For you wellness exam today I have ordered cbc, cmp and lipid panel.  Vaccine given today flu vaccine.  Recommend exercise and healthy diet.  We will let you know lab results as they come in.  Follow up date appointment will be determined after lab review.   For htn will increase losartan to 100 mg daily. Stop hctz. Continue b-blocker.   For allergies, continue flonase and start singulair. Can stop xyzal.  Mammogram order placed again. Go down stairs to get scheduled.  For your rare occasional wheeze at night. When trying to fall asleep can try albuterol inhaler and see if that resolve completely. Give me update after use.  Follow up one month or as needed.   Preventive Care 41-45 Years Old, Female Preventive care refers to visits with your health care provider and lifestyle choices that can promote health and wellness. This includes:  A yearly physical exam. This may also be called an annual well check.  Regular dental visits and eye exams.  Immunizations.  Screening for certain conditions.  Healthy lifestyle choices, such as eating a healthy diet, getting regular exercise, not using drugs or products that contain nicotine and tobacco, and limiting alcohol use. What can I expect for my preventive care visit? Physical exam Your health care provider will check your:  Height and weight. This may be used to calculate body mass index (BMI), which tells if you are at a healthy weight.  Heart rate and blood pressure.  Skin for abnormal spots. Counseling Your health care provider may ask you questions about your:  Alcohol, tobacco, and drug use.  Emotional well-being.  Home and relationship well-being.  Sexual activity.  Eating habits.  Work and work Statistician.  Method of birth control.  Menstrual cycle.  Pregnancy history. What immunizations do I need?  Influenza (flu) vaccine  This is recommended every year. Tetanus, diphtheria, and pertussis (Tdap)  vaccine  You may need a Td booster every 10 years. Varicella (chickenpox) vaccine  You may need this if you have not been vaccinated. Zoster (shingles) vaccine  You may need this after age 16. Measles, mumps, and rubella (MMR) vaccine  You may need at least one dose of MMR if you were born in 1957 or later. You may also need a second dose. Pneumococcal conjugate (PCV13) vaccine  You may need this if you have certain conditions and were not previously vaccinated. Pneumococcal polysaccharide (PPSV23) vaccine  You may need one or two doses if you smoke cigarettes or if you have certain conditions. Meningococcal conjugate (MenACWY) vaccine  You may need this if you have certain conditions. Hepatitis A vaccine  You may need this if you have certain conditions or if you travel or work in places where you may be exposed to hepatitis A. Hepatitis B vaccine  You may need this if you have certain conditions or if you travel or work in places where you may be exposed to hepatitis B. Haemophilus influenzae type b (Hib) vaccine  You may need this if you have certain conditions. Human papillomavirus (HPV) vaccine  If recommended by your health care provider, you may need three doses over 6 months. You may receive vaccines as individual doses or as more than one vaccine together in one shot (combination vaccines). Talk with your health care provider about the risks and benefits of combination vaccines. What tests do I need? Blood tests  Lipid and cholesterol levels. These may be checked every 5 years, or more frequently if you are  over 41 years old.  Hepatitis C test.  Hepatitis B test. Screening  Lung cancer screening. You may have this screening every year starting at age 41 if you have a 30-pack-year history of smoking and currently smoke or have quit within the past 15 years.  Colorectal cancer screening. All adults should have this screening starting at age 21 and continuing until  age 52. Your health care provider may recommend screening at age 3 if you are at increased risk. You will have tests every 1-10 years, depending on your results and the type of screening test.  Diabetes screening. This is done by checking your blood sugar (glucose) after you have not eaten for a while (fasting). You may have this done every 1-3 years.  Mammogram. This may be done every 1-2 years. Talk with your health care provider about when you should start having regular mammograms. This may depend on whether you have a family history of breast cancer.  BRCA-related cancer screening. This may be done if you have a family history of breast, ovarian, tubal, or peritoneal cancers.  Pelvic exam and Pap test. This may be done every 3 years starting at age 88. Starting at age 36, this may be done every 5 years if you have a Pap test in combination with an HPV test. Other tests  Sexually transmitted disease (STD) testing.  Bone density scan. This is done to screen for osteoporosis. You may have this scan if you are at high risk for osteoporosis. Follow these instructions at home: Eating and drinking  Eat a diet that includes fresh fruits and vegetables, whole grains, lean protein, and low-fat dairy.  Take vitamin and mineral supplements as recommended by your health care provider.  Do not drink alcohol if: ? Your health care provider tells you not to drink. ? You are pregnant, may be pregnant, or are planning to become pregnant.  If you drink alcohol: ? Limit how much you have to 0-1 drink a day. ? Be aware of how much alcohol is in your drink. In the [place] one drink equals one 12 oz bottle of beer (355 mL), one 5 oz glass of wine (148 mL), or one 1 oz glass of hard liquor (44 mL). Lifestyle  Take daily care of your teeth and gums.  Stay active. Exercise for at least 30 minutes on 5 or more days each week.  Do not use any products that contain nicotine or tobacco, such as cigarettes,  e-cigarettes, and chewing tobacco. If you need help quitting, ask your health care provider.  If you are sexually active, practice safe sex. Use a condom or other form of birth control (contraception) in order to prevent pregnancy and STIs (sexually transmitted infections).  If told by your health care provider, take low-dose aspirin daily starting at age 31. What's next?  Visit your health care provider once a year for a well check visit.  Ask your health care provider how often you should have your eyes and teeth checked.  Stay up to date on all vaccines. This information is not intended to replace advice given to you by your health care provider. Make sure you discuss any questions you have with your health care provider. Document Released: 09/02/2015 Document Revised: 04/17/2018 Document Reviewed: 04/17/2018 Elsevier Patient Education  2020 [employer].

## 2019-08-19 NOTE — Addendum Note (Signed)
Addended by: Anabel Halon on: 08/19/2019 10:57 AM   Modules accepted: Orders

## 2019-08-24 ENCOUNTER — Other Ambulatory Visit (INDEPENDENT_AMBULATORY_CARE_PROVIDER_SITE_OTHER): Payer: BC Managed Care – PPO

## 2019-08-24 DIAGNOSIS — R739 Hyperglycemia, unspecified: Secondary | ICD-10-CM

## 2019-08-24 LAB — HEMOGLOBIN A1C: Hgb A1c MFr Bld: 6.8 % — ABNORMAL HIGH (ref 4.6–6.5)

## 2019-08-26 ENCOUNTER — Other Ambulatory Visit: Payer: Self-pay

## 2019-08-26 ENCOUNTER — Ambulatory Visit (HOSPITAL_BASED_OUTPATIENT_CLINIC_OR_DEPARTMENT_OTHER)
Admission: RE | Admit: 2019-08-26 | Discharge: 2019-08-26 | Disposition: A | Discharge status: Home / Self Care | Payer: BC Managed Care – PPO | Source: Ambulatory Visit | Attending: Medical | Admitting: Medical

## 2019-08-26 DIAGNOSIS — Z1231 Encounter for screening mammogram for malignant neoplasm of breast: Secondary | ICD-10-CM | POA: Diagnosis present

## 2019-08-31 ENCOUNTER — Telehealth: Payer: Self-pay | Admitting: Medical

## 2019-08-31 DIAGNOSIS — E119 Type 2 diabetes mellitus without complications: Secondary | ICD-10-CM

## 2019-08-31 MED ORDER — METFORMIN HCL 500 MG PO TABS: 500.0000 mg | ORAL_TABLET | Freq: Every day | ORAL | 3 refills | Status: DC

## 2019-08-31 NOTE — Telephone Encounter (Signed)
Rx metformin sent. and diabetic education referral placed.

## 2019-10-04 ENCOUNTER — Telehealth: Payer: Self-pay | Admitting: Medical

## 2019-10-04 NOTE — Telephone Encounter (Signed)
Opened to review 

## 2019-10-04 NOTE — Telephone Encounter (Signed)
Will you make sure she has follow up appointment for diabetes 2nd week of April. Will get a1c and fasting lipid panel.

## 2019-10-05 NOTE — Telephone Encounter (Signed)
Patient scheduled.

## 2019-10-13 ENCOUNTER — Other Ambulatory Visit: Payer: Self-pay | Admitting: Medical

## 2019-12-01 ENCOUNTER — Other Ambulatory Visit: Payer: Self-pay

## 2019-12-01 MED ORDER — METFORMIN HCL 500 MG PO TABS: 500.0000 mg | ORAL_TABLET | Freq: Every day | ORAL | 3 refills | Status: DC

## 2019-12-02 ENCOUNTER — Other Ambulatory Visit: Payer: Self-pay

## 2019-12-02 ENCOUNTER — Encounter: Payer: Self-pay | Admitting: Medical

## 2019-12-02 ENCOUNTER — Ambulatory Visit: Payer: BC Managed Care – PPO | Admitting: Medical

## 2019-12-02 VITALS — BP 150/80 | HR 67 | Temp 98.0°F | Resp 18 | Ht 67.0 in | Wt 210.2 lb

## 2019-12-02 DIAGNOSIS — E119 Type 2 diabetes mellitus without complications: Secondary | ICD-10-CM

## 2019-12-02 DIAGNOSIS — I10 Essential (primary) hypertension: Secondary | ICD-10-CM

## 2019-12-02 LAB — HEMOGLOBIN A1C: Hgb A1c MFr Bld: 6.2 % (ref 4.6–6.5)

## 2019-12-02 LAB — COMPREHENSIVE METABOLIC PANEL
ALT: 23 U/L (ref 0–35)
AST: 26 U/L (ref 0–37)
Albumin: 3.8 g/dL (ref 3.5–5.2)
Alkaline Phosphatase: 77 U/L (ref 39–117)
BUN: 8 mg/dL (ref 6–23)
CO2: 25 mEq/L (ref 19–32)
Calcium: 9.6 mg/dL (ref 8.4–10.5)
Chloride: 105 mEq/L (ref 96–112)
Creatinine, Ser: 0.7 mg/dL (ref 0.40–1.20)
GFR: 111.01 mL/min (ref >60.00)
Glucose, Bld: 95 mg/dL (ref 70–99)
Potassium: 4.3 mEq/L (ref 3.5–5.1)
Sodium: 138 mEq/L (ref 135–145)
Total Bilirubin: 0.4 mg/dL (ref 0.2–1.2)
Total Protein: 6.5 g/dL (ref 6.0–8.3)

## 2019-12-02 LAB — LIPID PANEL
Cholesterol: 139 mg/dL (ref 0–200)
HDL: 55.1 mg/dL (ref >39.00)
LDL Cholesterol: 69 mg/dL (ref 0–99)
NonHDL: 83.47
Total CHOL/HDL Ratio: 3
Triglycerides: 72 mg/dL (ref 0.0–149.0)
VLDL: 14.4 mg/dL (ref 0.0–40.0)

## 2019-12-02 NOTE — Progress Notes (Signed)
Subjective:    Patient ID: Nicole Ryan, female    DOB: 1976-03-14, 44 y.o.   MRN: [id number]  HPI Pt in for follow up.  Pt a1c was 6.8. She is walking 2-3 days a week for about 1 hour. Pt states diet is healthier. She states cut out all bread. Pt did start metformin. Lost about 5 lbs since last visit.  Pt blood pressure initially high today. Last couple of days. Pt states her smart watch gave bp reading 111/72.     Review of Systems  Constitutional: Negative for chills, fatigue and fever.  Respiratory: Negative for cough, chest tightness, shortness of breath and wheezing.   Cardiovascular: Negative for chest pain and palpitations.  Gastrointestinal: Negative for abdominal pain.  Musculoskeletal: Negative for back pain and neck pain.  Skin: Negative for rash.  Neurological: Negative for dizziness, weakness, light-headedness, numbness and headaches.  Hematological: Negative for adenopathy. Does not bruise/bleed easily.  Psychiatric/Behavioral: Negative for behavioral problems and confusion.    Past Medical History:  Diagnosis Date  . Allergy   . GERD (gastroesophageal reflux disease)    heart burn. 2-3 times a week.  . Hypertension      Social History   Socioeconomic History  . Marital status: Single    Spouse name: Not on file  . Number of children: Not on file  . Years of education: Not on file  . Highest education level: Not on file  Occupational History  . Occupation: Control and instrumentation engineer  Tobacco Use  . Smoking status: Never Smoker  . Smokeless tobacco: Never Used  Substance and Sexual Activity  . Alcohol use: Not Currently  . Drug use: Never  . Sexual activity: Not Currently    Birth control/protection: None  Other Topics Concern  . Not on file  Social History Narrative  . Not on file   Social Determinants of Health   Financial Resource Strain:   . Difficulty of Paying Living Expenses:   Food Insecurity:   . Worried About Charity fundraiser in the  Last Year:   . Arboriculturist in the Last Year:   Transportation Needs:   . Film/video editor (Medical):   Marland Kitchen Lack of Transportation (Non-Medical):   Physical Activity:   . Days of Exercise per Week:   . Minutes of Exercise per Session:   Stress:   . Feeling of Stress :   Social Connections:   . Frequency of Communication with Friends and Family:   . Frequency of Social Gatherings with Friends and Family:   . Attends Religious Services:   . Active Member of Clubs or Organizations:   . Attends Archivist Meetings:   Marland Kitchen Marital Status:   Intimate Partner Violence:   . Fear of Current or Ex-Partner:   . Emotionally Abused:   Marland Kitchen Physically Abused:   . Sexually Abused:     Past Surgical History:  Procedure Laterality Date  . BREAST BIOPSY Bilateral   . breast surg Bilateral    Beign tumors both breast    Family History  Problem Relation Age of Onset  . Aneurysm Mother   . Hypertension Mother   . Diabetes Mother     Not on File  Current Outpatient Medications on File Prior to Visit  Medication Sig Dispense Refill  . losartan (COZAAR) 25 MG tablet Take 1 tablet by mouth once daily 30 tablet 0  . albuterol (VENTOLIN HFA) 108 (90 Base) MCG/ACT inhaler Inhale 2 puffs into  the lungs every 6 (six) hours as needed. 18 g 0  . amoxicillin-clavulanate (AUGMENTIN) 875-125 MG tablet Take 1 tablet by mouth 2 (two) times daily. (Patient not taking: Reported on 12/02/2019) 20 tablet 0  . fluticasone (FLONASE) 50 MCG/ACT nasal spray SPRAY 2 SPRAYS INTO EACH NOSTRIL EVERY DAY 16 g 11  . losartan (COZAAR) 100 MG tablet Take 1 tablet (100 mg total) by mouth daily. 90 tablet 3  . metFORMIN (GLUCOPHAGE) 500 MG tablet Take 1 tablet (500 mg total) by mouth daily with breakfast. 30 tablet 3  . metoprolol succinate (TOPROL-XL) 50 MG 24 hr tablet Take 1 tablet (50 mg total) by mouth daily. Take with or immediately following a meal. 90 tablet 1  . montelukast (SINGULAIR) 10 MG tablet Take  1 tablet (10 mg total) by mouth at bedtime. 90 tablet 3  . nitrofurantoin, macrocrystal-monohydrate, (MACROBID) 100 MG capsule Take 1 capsule (100 mg total) by mouth 2 (two) times daily. (Patient not taking: Reported on 12/02/2019) 14 capsule 0   No current facility-administered medications on file prior to visit.    BP (!) 150/80   Pulse 67   Temp 98 F (36.7 C) (Temporal)   Resp 18   Ht 5\' 7"  (1.702 m)   Wt 210 lb 3.2 oz (95.3 kg)   LMP 11/17/2019 (Approximate)   SpO2 100%   BMI 32.92 kg/m       Objective:   Physical Exam   General Mental Status- Alert. General Appearance- Not in acute distress.   Skin General: Color- Normal Color. Moisture- Normal Moisture.  Neck Carotid Arteries- Normal color. Moisture- Normal Moisture. No carotid bruits. No JVD.  Chest and Lung Exam Auscultation: Breath Sounds:-Normal.  Cardiovascular Auscultation:Rythm- Regular. Murmurs & Other Heart Sounds:Auscultation of the heart reveals- No Murmurs.  Abdomen Inspection:-Inspeection Normal. Palpation/Percussion:Note:No mass. Palpation and Percussion of the abdomen reveal- Non Tender, Non Distended + BS, no rebound or guarding.    Neurologic Cranial Nerve exam:- CN III-XII intact(No nystagmus), symmetric smile. Strength:- 5/5 equal and symmetric strength both upper and lower extremities.     Assessment & Plan:  Glad to hear that you have been getting some exercise and diet has improved. Will get a1c today and cmp. Review those studies and see if need to make changes to diabetic management.  Also will get lipid panel to see if elevated levels.  Discussion on potential statin use.  For bp your level is elevated. Check bp at home with upper arm cuff daily  and update me on Friday with levels. May need other med to add to bring down closer to 130/80.  Follow up 3 months or as needed.  Time spent with patient today was 25  minutes which consisted of chart review, discussing diagnosis,  work up/labs, potential treatment modifications and documentation.  [name], PA-C

## 2019-12-02 NOTE — Patient Instructions (Addendum)
Glad to hear that you have been getting some exercise and diet has improved. Will get a1c today and cmp. Review those studies and see if need to make changes to diabetic management.  Also will get lipid panel to see if elevated levels.  Discussion on potential statin use.  For bp your level is elevated. Check bp at home with upper arm cuff daily  and update me on Friday with levels. May need other med to add to bring down closer to 130/80.  Follow up 3 months or as needed.

## 2019-12-03 ENCOUNTER — Telehealth: Payer: Self-pay | Admitting: Medical

## 2019-12-03 MED ORDER — ATORVASTATIN CALCIUM 10 MG PO TABS: 10.0000 mg | ORAL_TABLET | Freq: Every day | ORAL | 3 refills | Status: DC

## 2019-12-03 NOTE — Telephone Encounter (Signed)
Rx atorvastatin sent to pt pharmacy. 

## 2019-12-14 ENCOUNTER — Encounter: Payer: Self-pay | Admitting: Family Medicine

## 2019-12-14 ENCOUNTER — Other Ambulatory Visit: Payer: Self-pay

## 2019-12-14 ENCOUNTER — Other Ambulatory Visit (HOSPITAL_COMMUNITY)
Admission: RE | Admit: 2019-12-14 | Discharge: 2019-12-14 | Disposition: A | Discharge status: Home / Self Care | Payer: BC Managed Care – PPO | Source: Ambulatory Visit | Attending: Family Medicine | Admitting: Family Medicine

## 2019-12-14 ENCOUNTER — Ambulatory Visit: Payer: BC Managed Care – PPO | Admitting: Family Medicine

## 2019-12-14 VITALS — BP 126/80 | HR 92 | Temp 96.8°F | Resp 16 | Ht 67.0 in | Wt 209.0 lb

## 2019-12-14 DIAGNOSIS — R300 Dysuria: Secondary | ICD-10-CM | POA: Insufficient documentation

## 2019-12-14 DIAGNOSIS — N898 Other specified noninflammatory disorders of vagina: Secondary | ICD-10-CM | POA: Diagnosis present

## 2019-12-14 DIAGNOSIS — R82998 Other abnormal findings in urine: Secondary | ICD-10-CM | POA: Insufficient documentation

## 2019-12-14 DIAGNOSIS — R350 Frequency of micturition: Secondary | ICD-10-CM | POA: Diagnosis not present

## 2019-12-14 LAB — POC URINALSYSI DIPSTICK (AUTOMATED)
Bilirubin, UA: NEGATIVE
Blood, UA: NEGATIVE
Glucose, UA: NEGATIVE
Ketones, UA: NEGATIVE
Nitrite, UA: NEGATIVE
Protein, UA: POSITIVE — AB
Spec Grav, UA: >=1.03 — AB (ref 1.010–1.025)
Urobilinogen, UA: 0.2 E.U./dL
pH, UA: 5.5 (ref 5.0–8.0)

## 2019-12-14 MED ORDER — CIPROFLOXACIN HCL 250 MG PO TABS: 250.0000 mg | ORAL_TABLET | Freq: Two times a day (BID) | ORAL | 0 refills | Status: DC

## 2019-12-14 MED ORDER — FLUCONAZOLE 150 MG PO TABS: ORAL_TABLET | ORAL | 0 refills | Status: DC

## 2019-12-14 NOTE — Progress Notes (Signed)
Patient ID: Nicole Ryan, female    DOB: 1976-08-15  Age: 44 y.o. MRN: [id number]    Subjective:  Subjective  HPI Nicole Ryan presents for 4 day hx dysuria , vaginal itching and d/c.   No odor.  No fever, no back pain  + urinary frequency  Review of Systems  Constitutional: Negative for appetite change, diaphoresis, fatigue and unexpected weight change.  Eyes: Negative for pain, redness and visual disturbance.  Respiratory: Negative for cough, chest tightness, shortness of breath and wheezing.   Cardiovascular: Negative for chest pain, palpitations and leg swelling.  Endocrine: Negative for cold intolerance, heat intolerance, polydipsia, polyphagia and polyuria.  Genitourinary: Positive for dysuria, frequency and vaginal discharge. Negative for decreased urine volume, difficulty urinating, flank pain, genital sores, hematuria, menstrual problem, pelvic pain, urgency, vaginal bleeding and vaginal pain.  Neurological: Negative for dizziness, light-headedness, numbness and headaches.    History Past Medical History:  Diagnosis Date  . Allergy   . GERD (gastroesophageal reflux disease)    heart burn. 2-3 times a week.  . Hypertension     She has a past surgical history that includes breast surg (Bilateral) and Breast biopsy (Bilateral).   Her family history includes Aneurysm in her mother; Diabetes in her mother; Hypertension in her mother.She reports that she has never smoked. She has never used smokeless tobacco. She reports previous alcohol use. She reports that she does not use drugs.  Current Outpatient Medications on File Prior to Visit  Medication Sig Dispense Refill  . albuterol (VENTOLIN HFA) 108 (90 Base) MCG/ACT inhaler Inhale 2 puffs into the lungs every 6 (six) hours as needed. 18 g 0  . atorvastatin (LIPITOR) 10 MG tablet Take 1 tablet (10 mg total) by mouth daily. 90 tablet 3  . fluticasone (FLONASE) 50 MCG/ACT nasal spray SPRAY 2 SPRAYS INTO EACH NOSTRIL EVERY DAY  16 g 11  . losartan (COZAAR) 100 MG tablet Take 1 tablet (100 mg total) by mouth daily. 90 tablet 3  . metFORMIN (GLUCOPHAGE) 500 MG tablet Take 1 tablet (500 mg total) by mouth daily with breakfast. 30 tablet 3  . metoprolol succinate (TOPROL-XL) 50 MG 24 hr tablet Take 1 tablet (50 mg total) by mouth daily. Take with or immediately following a meal. 90 tablet 1  . montelukast (SINGULAIR) 10 MG tablet Take 1 tablet (10 mg total) by mouth at bedtime. 90 tablet 3   No current facility-administered medications on file prior to visit.     Objective:  Objective  Physical Exam Vitals and nursing note reviewed.  Constitutional:      Appearance: She is well-developed.  HENT:     Head: Normocephalic and atraumatic.  Eyes:     Conjunctiva/sclera: Conjunctivae normal.  Neck:     Thyroid: No thyromegaly.     Vascular: No carotid bruit or JVD.  Cardiovascular:     Rate and Rhythm: Normal rate and regular rhythm.  Neurological:     Mental Status: She is alert and oriented to person, place, and time.    BP 126/80 (BP Location: Left Arm, Patient Position: Sitting, Cuff Size: Large)   Pulse 92   Temp (!) 96.8 F (36 C) (Temporal)   Resp 16   Ht 5\' 7"  (1.702 m)   Wt 209 lb (94.8 kg)   LMP 11/17/2019 (Approximate)   SpO2 99%   BMI 32.73 kg/m  Wt Readings from Last 3 Encounters:  12/14/19 209 lb (94.8 kg)  12/02/19 210 lb 3.2 oz (95.3 kg)  08/19/19 215 lb 3.2 oz (97.6 kg)     Lab Results  Component Value Date   WBC 6.7 08/19/2019   HGB 9.6 (L) 08/19/2019   HCT 32.6 (L) 08/19/2019   PLT 540.0 (H) 08/19/2019   GLUCOSE 95 12/02/2019   CHOL 139 12/02/2019   TRIG 72.0 12/02/2019   HDL 55.10 12/02/2019   LDLCALC 69 12/02/2019   ALT 23 12/02/2019   AST 26 12/02/2019   NA 138 12/02/2019   K 4.3 12/02/2019   CL 105 12/02/2019   CREATININE 0.70 12/02/2019   BUN 8 12/02/2019   CO2 25 12/02/2019   HGBA1C 6.2 12/02/2019    MM 3D SCREEN BREAST BILATERAL  Result Date:  08/26/2019 CLINICAL DATA:  Screening. EXAM: DIGITAL SCREENING BILATERAL MAMMOGRAM WITH TOMO AND CAD COMPARISON:  Previous exam(s). ACR Breast Density Category b: There are scattered areas of fibroglandular density. FINDINGS: There are no findings suspicious for malignancy. Images were processed with CAD. IMPRESSION: No mammographic evidence of malignancy. A result letter of this screening mammogram will be mailed directly to the patient. RECOMMENDATION: Screening mammogram in one year. (Code:SM-B-01Y) BI-RADS CATEGORY  1: Negative. Electronically Signed   By: Kristopher Oppenheim M.D.   On: 08/26/2019 15:17     Assessment & Plan:  Plan  I have discontinued Nicole Ryan's nitrofurantoin (macrocrystal-monohydrate) and amoxicillin-clavulanate. I am also having her start on fluconazole and ciprofloxacin. Additionally, I am having her maintain her fluticasone, montelukast, metoprolol succinate, losartan, albuterol, metFORMIN, and atorvastatin.  Meds ordered this encounter  Medications  . fluconazole (DIFLUCAN) 150 MG tablet    Sig: 1 po x1, may repeat in 3 days prn    Dispense:  2 tablet    Refill:  0  . ciprofloxacin (CIPRO) 250 MG tablet    Sig: Take 1 tablet (250 mg total) by mouth 2 (two) times daily.    Dispense:  6 tablet    Refill:  0    Problem List Items Addressed This Visit    None    Visit Diagnoses    Vaginal discharge    -  Primary   Relevant Medications   fluconazole (DIFLUCAN) 150 MG tablet   Other Relevant Orders   POCT Urinalysis Dipstick (Automated) (Completed)   Urine Culture   Leukocytes in urine       Relevant Medications   fluconazole (DIFLUCAN) 150 MG tablet   ciprofloxacin (CIPRO) 250 MG tablet   Other Relevant Orders   Urine Culture      Follow-up: Return if symptoms worsen or fail to improve.  Ann Held, DO

## 2019-12-14 NOTE — Addendum Note (Signed)
Addended by: Kelle Darting A on: 12/14/2019 05:18 PM   Modules accepted: Orders

## 2019-12-14 NOTE — Patient Instructions (Signed)
Vaginitis Vaginitis is a condition in which the vaginal tissue swells and becomes red (inflamed). This condition is most often caused by a change in the normal balance of bacteria and yeast that live in the vagina. This change causes an overgrowth of certain bacteria or yeast, which causes the inflammation. There are different types of vaginitis, but the most common types are:  Bacterial vaginosis.  Yeast infection (candidiasis).  Trichomoniasis vaginitis. This is a sexually transmitted disease (STD).  Viral vaginitis.  Atrophic vaginitis.  Allergic vaginitis. What are the causes? The cause of this condition depends on the type of vaginitis. It can be caused by:  Bacteria (bacterial vaginosis).  Yeast, which is a fungus (yeast infection).  A parasite (trichomoniasis vaginitis).  A virus (viral vaginitis).  Low hormone levels (atrophic vaginitis). Low hormone levels can occur during pregnancy, breastfeeding, or after menopause.  Irritants, such as bubble baths, scented tampons, and feminine sprays (allergic vaginitis). Other factors can change the normal balance of the yeast and bacteria that live in the vagina. These include:  Antibiotic medicines.  Poor hygiene.  Diaphragms, vaginal sponges, spermicides, birth control pills, and intrauterine devices (IUD).  Sex.  Infection.  Uncontrolled diabetes.  A weakened defense (immune) system. What increases the risk? This condition is more likely to develop in women who:  Smoke.  Use vaginal douches, scented tampons, or scented sanitary pads.  Wear tight-fitting pants.  Wear thong underwear.  Use oral birth control pills or an IUD.  Have sex without a condom.  Have multiple sex partners.  Have an STD.  Frequently use the spermicide nonoxynol-9.  Eat lots of foods high in sugar.  Have uncontrolled diabetes.  Have low estrogen levels.  Have a weakened immune system from an immune disorder or medical  treatment.  Are pregnant or breastfeeding. What are the signs or symptoms? Symptoms vary depending on the cause of the vaginitis. Common symptoms include:  Abnormal vaginal discharge. ? The discharge is white, gray, or yellow with bacterial vaginosis. ? The discharge is thick, white, and cheesy with a yeast infection. ? The discharge is frothy and yellow or greenish with trichomoniasis.  A bad vaginal smell. The smell is fishy with bacterial vaginosis.  Vaginal itching, pain, or swelling.  Sex that is painful.  Pain or burning when urinating. Sometimes there are no symptoms. How is this diagnosed? This condition is diagnosed based on your symptoms and medical history. A physical exam, including a pelvic exam, will also be done. You may also have other tests, including:  Tests to determine the pH level (acidity or alkalinity) of your vagina.  A whiff test, to assess the odor that results when a sample of your vaginal discharge is mixed with a potassium hydroxide solution.  Tests of vaginal fluid. A sample will be examined under a microscope. How is this treated? Treatment varies depending on the type of vaginitis you have. Your treatment may include:  Antibiotic creams or pills to treat bacterial vaginosis and trichomoniasis.  Antifungal medicines, such as vaginal creams or suppositories, to treat a yeast infection.  Medicine to ease discomfort if you have viral vaginitis. Your sexual partner should also be treated.  Estrogen delivered in a cream, pill, suppository, or vaginal ring to treat atrophic vaginitis. If vaginal dryness occurs, lubricants and moisturizing creams may help. You may need to avoid scented soaps, sprays, or douches.  Stopping use of a product that is causing allergic vaginitis. Then using a vaginal cream to treat the symptoms. Follow   these instructions at home: Lifestyle  Keep your genital area clean and dry. Avoid soap, and only rinse the area with  water.  Do not douche or use tampons until your health care provider says it is okay to do so. Use sanitary pads, if needed.  Do not have sex until your health care provider approves. When you can return to sex, practice safe sex and use condoms.  Wipe from front to back. This avoids the spread of bacteria from the rectum to the vagina. General instructions  Take over-the-counter and prescription medicines only as told by your health care provider.  If you were prescribed an antibiotic medicine, take or use it as told by your health care provider. Do not stop taking or using the antibiotic even if you start to feel better.  Keep all follow-up visits as told by your health care provider. This is important. How is this prevented?  Use mild, non-scented products. Do not use things that can irritate the vagina, such as fabric softeners. Avoid the following products if they are scented: ? Feminine sprays. ? Detergents. ? Tampons. ? Feminine hygiene products. ? Soaps or bubble baths.  Let air reach your genital area. ? Wear cotton underwear to reduce moisture buildup. ? Avoid wearing underwear while you sleep. ? Avoid wearing tight pants and underwear or nylons without a cotton panel. ? Avoid wearing thong underwear.  Take off any wet clothing, such as bathing suits, as soon as possible.  Practice safe sex and use condoms. Contact a health care provider if:  You have abdominal pain.  You have a fever.  You have symptoms that last for more than 2-3 days. Get help right away if:  You have a fever and your symptoms suddenly get worse. Summary  Vaginitis is a condition in which the vaginal tissue becomes inflamed.This condition is most often caused by a change in the normal balance of bacteria and yeast that live in the vagina.  Treatment varies depending on the type of vaginitis you have.  Do not douche, use tampons , or have sex until your health care provider approves. When  you can return to sex, practice safe sex and use condoms. This information is not intended to replace advice given to you by your health care provider. Make sure you discuss any questions you have with your health care provider. Document Revised: 07/19/2017 Document Reviewed: 09/11/2016 Elsevier Patient Education  2020 [employer]  

## 2019-12-15 ENCOUNTER — Other Ambulatory Visit: Payer: Self-pay | Admitting: Family Medicine

## 2019-12-16 LAB — URINE CYTOLOGY ANCILLARY ONLY
Bacterial vaginitis: POSITIVE — AB
Candida vaginitis: NEGATIVE

## 2019-12-16 LAB — URINE CULTURE
MICRO NUMBER:: 10405250
SPECIMEN QUALITY:: ADEQUATE

## 2019-12-17 LAB — URINE CYTOLOGY ANCILLARY ONLY
Bacterial Vaginitis-Urine: POSITIVE — AB
Candida Urine: NEGATIVE
Chlamydia: NEGATIVE
Comment: NEGATIVE
Comment: NEGATIVE
Comment: NORMAL
Neisseria Gonorrhea: NEGATIVE
Trichomonas: NEGATIVE

## 2019-12-21 ENCOUNTER — Other Ambulatory Visit: Payer: Self-pay | Admitting: Family Medicine

## 2019-12-21 MED ORDER — METRONIDAZOLE 500 MG PO TABS: 500.0000 mg | ORAL_TABLET | Freq: Two times a day (BID) | ORAL | 0 refills | Status: DC

## 2020-02-16 ENCOUNTER — Other Ambulatory Visit: Payer: Self-pay | Admitting: Medical

## 2020-02-29 ENCOUNTER — Other Ambulatory Visit: Payer: Self-pay

## 2020-02-29 ENCOUNTER — Ambulatory Visit (HOSPITAL_COMMUNITY)
Admission: EM | Admit: 2020-02-29 | Discharge: 2020-02-29 | Payer: BC Managed Care – PPO | Attending: Family Medicine | Admitting: Family Medicine

## 2020-02-29 ENCOUNTER — Encounter (HOSPITAL_COMMUNITY): Payer: Self-pay

## 2020-02-29 DIAGNOSIS — Z3201 Encounter for pregnancy test, result positive: Secondary | ICD-10-CM

## 2020-02-29 DIAGNOSIS — Z3490 Encounter for supervision of normal pregnancy, unspecified, unspecified trimester: Secondary | ICD-10-CM | POA: Diagnosis not present

## 2020-02-29 LAB — POC URINE PREG, ED: Preg Test, Ur: POSITIVE — AB

## 2020-02-29 NOTE — ED Triage Notes (Signed)
Pt states took 2 OTC pregnancy tests and they were both +. PT c/o lower abdominal cramping and spotting started yesterday. Pt LMP 01/17/2020.

## 2020-02-29 NOTE — ED Notes (Signed)
Patient is being discharged from the Urgent Care and sent to the Emergency Department via personal vehicle by self . Per Dr Meda Coffee, patient is in need of higher level of care due to pregnancy complications. Patient is aware and verbalizes understanding of plan of care.   Vitals:   02/29/20 1718  BP: (!) 139/58  Pulse: 91  Resp: 16  Temp: 98.4 F (36.9 C)  SpO2: 100%

## 2020-02-29 NOTE — Discharge Instructions (Signed)
With pregnancy and abdominal pain and bleeding you need to go to [facility] hospital ER

## 2020-02-29 NOTE — ED Provider Notes (Signed)
[facility] [facility]    CSN: [id number] Arrival date & time: 02/29/20  [place]      History   Chief Complaint Chief Complaint  Patient presents with  . Possible Pregnancy    HPI Nishka Heide is a 44 y.o. female.   HPI  Patient checked in with a chief complaint of pregnancy and vaginal bleeding We did a pregnancy test as soon as this was recognized With her pregnancy test positive she was triaged to women's emergency room  Past Medical History:  Diagnosis Date  . Allergy   . GERD (gastroesophageal reflux disease)    heart burn. 2-3 times a week.  . Hypertension     There are no problems to display for this patient.   Past Surgical History:  Procedure Laterality Date  . BREAST BIOPSY Bilateral   . breast surg Bilateral    Beign tumors both breast    OB History    Gravida  4   Para  3   Term  3   Preterm      AB  1   Living  3     SAB  1   TAB      Ectopic      Multiple      Live Births  3            Home Medications    Prior to Admission medications   Medication Sig Start Date End Date Taking? Authorizing Provider  albuterol (VENTOLIN HFA) 108 (90 Base) MCG/ACT inhaler Inhale 2 puffs into the lungs every 6 (six) hours as needed. 08/19/19   Saguier, Percell Miller, PA-C  atorvastatin (LIPITOR) 10 MG tablet Take 1 tablet (10 mg total) by mouth daily. 12/03/19   Saguier, Percell Miller, PA-C  ciprofloxacin (CIPRO) 250 MG tablet Take 1 tablet (250 mg total) by mouth 2 (two) times daily. 12/14/19   Ann Held, DO  fluconazole (DIFLUCAN) 150 MG tablet 1 po x1, may repeat in 3 days prn 12/14/19   Carollee Herter, Alferd Apa, DO  fluticasone [facility]) 50 MCG/ACT nasal spray SPRAY 2 SPRAYS INTO EACH NOSTRIL EVERY DAY 02/03/19   Saguier, Percell Miller, PA-C  losartan (COZAAR) 100 MG tablet Take 1 tablet (100 mg total) by mouth daily. 08/19/19   Saguier, Percell Miller, PA-C  metFORMIN (GLUCOPHAGE) 500 MG tablet Take 1 tablet (500 mg total) by mouth daily with breakfast.  12/01/19   Saguier, Percell Miller, PA-C  metoprolol succinate (TOPROL-XL) 50 MG 24 hr tablet TAKE 1 TABLET BY MOUTH ONCE DAILY --TAKE  WITH  OR  IMMEDIATELY  FOLLOWING  A  MEAL 02/16/20   Saguier, Percell Miller, PA-C  metroNIDAZOLE (FLAGYL) 500 MG tablet Take 1 tablet (500 mg total) by mouth 2 (two) times daily. 12/21/19   Mosie Lukes, MD  montelukast (SINGULAIR) 10 MG tablet Take 1 tablet (10 mg total) by mouth at bedtime. 02/03/19   Saguier, Percell Miller, PA-C    Family History Family History  Problem Relation Age of Onset  . Aneurysm Mother   . Hypertension Mother   . Diabetes Mother     Social History Social History   Tobacco Use  . Smoking status: Never Smoker  . Smokeless tobacco: Never Used  Vaping Use  . Vaping Use: Never used  Substance Use Topics  . Alcohol use: Not Currently  . Drug use: Never     Allergies   Patient has no known allergies.   Review of Systems Review of Systems  Genitourinary: Positive for vaginal bleeding.  Physical Exam Triage Vital Signs ED Triage Vitals  Enc Vitals Group     BP 02/29/20 1718 (!) 139/58     Pulse Rate 02/29/20 1718 91     Resp 02/29/20 1718 16     Temp 02/29/20 1718 98.4 F (36.9 C)     Temp Source 02/29/20 1718 Oral     SpO2 02/29/20 1718 100 %     Weight 02/29/20 1721 210 lb (95.3 kg)     Height 02/29/20 1721 5\' 7"  (1.702 m)     Head Circumference --      Peak Flow --      Pain Score 02/29/20 1721 1     Pain Loc --      Pain Edu? --      Excl. in [place]? --    No data found.  Updated Vital Signs BP (!) 139/58   Pulse 91   Temp 98.4 F (36.9 C) (Oral)   Resp 16   Ht 5\' 7"  (1.702 m)   Wt 95.3 kg   SpO2 100%   BMI 32.89 kg/m      Physical Exam  Physical exam not performed.  Patient visually is in no acute distress. UC Treatments / Results  Labs (all labs ordered are listed, but only abnormal results are displayed) Labs Reviewed  POC URINE PREG, ED - Abnormal; Notable for the following components:      Result  Value   Preg Test, Ur POSITIVE (*)    All other components within normal limits    EKG   Radiology No results found.  Procedures Procedures (including critical care time)  Medications Ordered in UC Medications - No data to display  Initial Impression / Assessment and Plan / UC Course  I have reviewed the triage vital signs and the nursing notes.  Pertinent labs & imaging results that were available during my care of the patient were reviewed by me and considered in my medical decision making (see chart for details).  Clinical Course as of Feb 28 2157  Mon Feb 29, 2020  1741 POCT Pregnancy, Urine [YN]    Clinical Course User Index [YN] Raylene Everts, MD     Final Clinical Impressions(s) / UC Diagnoses   Final diagnoses:  Pregnancy, unspecified gestational age     Discharge Instructions     With pregnancy and abdominal pain and bleeding you need to go to [facility] hospital ER    ED Prescriptions    None     PDMP not reviewed this encounter.   Raylene Everts, MD 03/03/20 1121

## 2020-03-03 ENCOUNTER — Emergency Department (HOSPITAL_COMMUNITY): Payer: BC Managed Care – PPO

## 2020-03-03 ENCOUNTER — Encounter (HOSPITAL_COMMUNITY): Payer: Self-pay | Admitting: Obstetrics and Gynecology

## 2020-03-03 ENCOUNTER — Other Ambulatory Visit: Payer: Self-pay

## 2020-03-03 DIAGNOSIS — O26891 Other specified pregnancy related conditions, first trimester: Secondary | ICD-10-CM | POA: Diagnosis present

## 2020-03-03 DIAGNOSIS — Z3A01 Less than 8 weeks gestation of pregnancy: Secondary | ICD-10-CM | POA: Insufficient documentation

## 2020-03-03 DIAGNOSIS — Z7984 Long term (current) use of oral hypoglycemic drugs: Secondary | ICD-10-CM | POA: Diagnosis not present

## 2020-03-03 DIAGNOSIS — O209 Hemorrhage in early pregnancy, unspecified: Secondary | ICD-10-CM | POA: Insufficient documentation

## 2020-03-03 DIAGNOSIS — Z79899 Other long term (current) drug therapy: Secondary | ICD-10-CM | POA: Insufficient documentation

## 2020-03-03 DIAGNOSIS — O139 Gestational [pregnancy-induced] hypertension without significant proteinuria, unspecified trimester: Secondary | ICD-10-CM | POA: Diagnosis not present

## 2020-03-03 LAB — POC URINE PREG, ED: Preg Test, Ur: POSITIVE — AB

## 2020-03-03 NOTE — ED Triage Notes (Signed)
Pt states took 2 OTC pregnancy tests and they were both +. PT c/o lower abdominal cramping and spotting started 7/11. Patient reports increased cramping and bleeding.

## 2020-03-04 ENCOUNTER — Ambulatory Visit: Payer: BC Managed Care – PPO | Admitting: Medical

## 2020-03-04 ENCOUNTER — Emergency Department (HOSPITAL_COMMUNITY)
Admission: EM | Admit: 2020-03-04 | Discharge: 2020-03-04 | Disposition: A | Discharge status: Home / Self Care | Payer: BC Managed Care – PPO | Attending: Emergency Medicine | Admitting: Emergency Medicine

## 2020-03-04 DIAGNOSIS — N939 Abnormal uterine and vaginal bleeding, unspecified: Secondary | ICD-10-CM

## 2020-03-04 DIAGNOSIS — Z0289 Encounter for other administrative examinations: Secondary | ICD-10-CM

## 2020-03-04 LAB — BASIC METABOLIC PANEL
Anion gap: 10 (ref 5–15)
BUN: 9 mg/dL (ref 6–20)
CO2: 21 mmol/L — ABNORMAL LOW (ref 22–32)
Calcium: 9.1 mg/dL (ref 8.9–10.3)
Chloride: 104 mmol/L (ref 98–111)
Creatinine, Ser: 0.66 mg/dL (ref 0.44–1.00)
GFR calc Af Amer: >60 mL/min (ref >60)
GFR calc non Af Amer: >60 mL/min (ref >60)
Glucose, Bld: 128 mg/dL — ABNORMAL HIGH (ref 70–99)
Potassium: 4.1 mmol/L (ref 3.5–5.1)
Sodium: 135 mmol/L (ref 135–145)

## 2020-03-04 LAB — CBC WITH DIFFERENTIAL/PLATELET
Abs Immature Granulocytes: 0.04 10*3/uL (ref 0.00–0.07)
Basophils Absolute: 0 10*3/uL (ref 0.0–0.1)
Basophils Relative: 0 %
Eosinophils Absolute: 0.1 10*3/uL (ref 0.0–0.5)
Eosinophils Relative: 1 %
HCT: 30.4 % — ABNORMAL LOW (ref 36.0–46.0)
Hemoglobin: 8.6 g/dL — ABNORMAL LOW (ref 12.0–15.0)
Immature Granulocytes: 0 %
Lymphocytes Relative: 24 %
Lymphs Abs: 3 10*3/uL (ref 0.7–4.0)
MCH: 18.2 pg — ABNORMAL LOW (ref 26.0–34.0)
MCHC: 28.3 g/dL — ABNORMAL LOW (ref 30.0–36.0)
MCV: 64.4 fL — ABNORMAL LOW (ref 80.0–100.0)
Monocytes Absolute: 0.9 10*3/uL (ref 0.1–1.0)
Monocytes Relative: 8 %
Neutro Abs: 8.3 10*3/uL — ABNORMAL HIGH (ref 1.7–7.7)
Neutrophils Relative %: 67 %
Platelets: 500 10*3/uL — ABNORMAL HIGH (ref 150–400)
RBC: 4.72 MIL/uL (ref 3.87–5.11)
RDW: 19.6 % — ABNORMAL HIGH (ref 11.5–15.5)
WBC: 12.3 10*3/uL — ABNORMAL HIGH (ref 4.0–10.5)
nRBC: 0 % (ref 0.0–0.2)

## 2020-03-04 LAB — RH IG WORKUP (INCLUDES ABO/RH)
ABO/RH(D): B POS
Gestational Age(Wks): 2

## 2020-03-04 LAB — HCG, QUANTITATIVE, PREGNANCY: hCG, Beta Chain, Quant, S: 364 m[IU]/mL — ABNORMAL HIGH (ref <5)

## 2020-03-04 NOTE — ED Provider Notes (Signed)
[facility] DEPT Provider Note   CSN: [id number] Arrival date & time: 03/03/20  1759     History Chief Complaint  Patient presents with  . Vaginal Bleeding  . Miscarriage    Nicole Ryan is a 44 y.o. female.  The history is provided by the patient and medical records.    44 year old G6P54 approximately [redacted] weeks gestation (LMP 01/18/20) with history of GERD, hypertension, presenting to the ED with vaginal bleeding and abdominal cramping.  States this initially began on 02/28/2020 and has become more heavy since then.  States she is having a lot of lower abdominal cramping which she occasions to menstrual cramps but slightly more severe.  She is not having any focal pain.  She does report continued bleeding without passage of clots or tissue noted.  She has had miscarriage in the past and was concerned for same.  She is not currently on anticoagulation.  States she has not yet established care with OB/GYN.  Past Medical History:  Diagnosis Date  . Allergy   . GERD (gastroesophageal reflux disease)    heart burn. 2-3 times a week.  . Hypertension     There are no problems to display for this patient.   Past Surgical History:  Procedure Laterality Date  . BREAST BIOPSY Bilateral   . breast surg Bilateral    Beign tumors both breast     OB History    Gravida  5   Para  3   Term  3   Preterm      AB  1   Living  3     SAB  1   TAB      Ectopic      Multiple      Live Births  3           Family History  Problem Relation Age of Onset  . Aneurysm Mother   . Hypertension Mother   . Diabetes Mother     Social History   Tobacco Use  . Smoking status: Never Smoker  . Smokeless tobacco: Never Used  Vaping Use  . Vaping Use: Never used  Substance Use Topics  . Alcohol use: Not Currently  . Drug use: Never    Home Medications Prior to Admission medications   Medication Sig Start Date End Date Taking? Authorizing  Provider  atorvastatin (LIPITOR) 10 MG tablet Take 1 tablet (10 mg total) by mouth daily. 12/03/19  Yes Saguier, Percell Miller, PA-C  fluticasone (FLONASE) 50 MCG/ACT nasal spray SPRAY 2 SPRAYS INTO EACH NOSTRIL EVERY DAY Patient taking differently: Place 2 sprays into both nostrils daily as needed for allergies or rhinitis.  02/03/19  Yes Saguier, Percell Miller, PA-C  losartan (COZAAR) 100 MG tablet Take 1 tablet (100 mg total) by mouth daily. 08/19/19  Yes Saguier, Percell Miller, PA-C  metFORMIN (GLUCOPHAGE) 500 MG tablet Take 1 tablet (500 mg total) by mouth daily with breakfast. 12/01/19  Yes Saguier, Percell Miller, PA-C  metoprolol succinate (TOPROL-XL) 50 MG 24 hr tablet TAKE 1 TABLET BY MOUTH ONCE DAILY --TAKE  WITH  OR  IMMEDIATELY  FOLLOWING  A  MEAL Patient taking differently: Take 50 mg by mouth daily.  02/16/20  Yes Saguier, Percell Miller, PA-C  montelukast (SINGULAIR) 10 MG tablet Take 1 tablet (10 mg total) by mouth at bedtime. Patient taking differently: Take 10 mg by mouth daily as needed. Allergies 02/03/19  Yes Saguier, Percell Miller, PA-C  Multiple Vitamin (MULTIVITAMIN WITH MINERALS) TABS tablet Take 1 tablet by  mouth daily.   Yes [provider]  albuterol (VENTOLIN HFA) 108 (90 Base) MCG/ACT inhaler Inhale 2 puffs into the lungs every 6 (six) hours as needed. Patient taking differently: Inhale 2 puffs into the lungs every 6 (six) hours as needed for wheezing or shortness of breath.  08/19/19   Saguier, Percell Miller, PA-C  ciprofloxacin (CIPRO) 250 MG tablet Take 1 tablet (250 mg total) by mouth 2 (two) times daily. Patient not taking: Reported on 03/04/2020 12/14/19   Carollee Herter, Alferd Apa, DO  fluconazole (DIFLUCAN) 150 MG tablet 1 po x1, may repeat in 3 days prn Patient not taking: Reported on 03/04/2020 12/14/19   Carollee Herter, Alferd Apa, DO  metroNIDAZOLE (FLAGYL) 500 MG tablet Take 1 tablet (500 mg total) by mouth 2 (two) times daily. Patient not taking: Reported on 03/04/2020 12/21/19   Mosie Lukes, MD    Allergies     Patient has no known allergies.  Review of Systems   Review of Systems  Gastrointestinal: Positive for abdominal pain (cramping).  Genitourinary: Positive for vaginal bleeding.  All other systems reviewed and are negative.   Physical Exam Updated Vital Signs BP (!) 177/82   Pulse 96   Temp 98.7 F (37.1 C) (Oral)   Resp 18   LMP 01/17/2020   SpO2 100%   Physical Exam Vitals and nursing note reviewed.  Constitutional:      Appearance: She is well-developed.  HENT:     Head: Normocephalic and atraumatic.  Eyes:     Conjunctiva/sclera: Conjunctivae normal.     Pupils: Pupils are equal, round, and reactive to light.  Cardiovascular:     Rate and Rhythm: Normal rate and regular rhythm.     Heart sounds: Normal heart sounds.  Pulmonary:     Effort: Pulmonary effort is normal.     Breath sounds: Normal breath sounds. No stridor. No wheezing.  Abdominal:     General: Bowel sounds are normal.     Palpations: Abdomen is soft.     Tenderness: There is no abdominal tenderness. There is no rebound.  Genitourinary:    Comments: RN chaperoned exam Normal female external genitalia without visible lesions/rash, large amount of blood in vaginal vault, cervical os does appear partially open, no clots or tissue products noted Musculoskeletal:        General: Normal range of motion.     Cervical back: Normal range of motion.  Skin:    General: Skin is warm and dry.  Neurological:     Mental Status: She is alert and oriented to person, place, and time.     ED Results / Procedures / Treatments   Labs (all labs ordered are listed, but only abnormal results are displayed) Labs Reviewed  CBC WITH DIFFERENTIAL/PLATELET - Abnormal; Notable for the following components:      Result Value   WBC 12.3 (*)    Hemoglobin 8.6 (*)    HCT 30.4 (*)    MCV 64.4 (*)    MCH 18.2 (*)    MCHC 28.3 (*)    RDW 19.6 (*)    Platelets 500 (*)    Neutro Abs 8.3 (*)    All other components within  normal limits  BASIC METABOLIC PANEL - Abnormal; Notable for the following components:   CO2 21 (*)    Glucose, Bld 128 (*)    All other components within normal limits  HCG, QUANTITATIVE, PREGNANCY - Abnormal; Notable for the following components:   hCG, Beta  Chain, Quant, S 364 (*)    All other components within normal limits  POC URINE PREG, ED - Abnormal; Notable for the following components:   Preg Test, Ur POSITIVE (*)    All other components within normal limits  POC OCCULT BLOOD, ED  RH IG WORKUP (INCLUDES ABO/RH)    EKG None  Radiology US OB Comp Less 14 Wks  Result Date: 03/04/2020 CLINICAL DATA:  44 year old pregnant female with vaginal bleeding. LMP: 01/18/2020 corresponding to an estimated gestational age of [redacted] weeks, 4 days. EXAM: OBSTETRIC <14 WK [place] AND TRANSVAGINAL OB US TECHNIQUE: Both transabdominal and transvaginal ultrasound examinations were performed for complete evaluation of the gestation as well as the maternal uterus, adnexal regions, and pelvic cul-de-sac. Transvaginal technique was performed to assess early pregnancy. COMPARISON:  None. FINDINGS: The uterus is anteverted and measures 12.3 x 6.4 x 6.6 cm. The uterus is heterogeneous. There is a 4.6 x 4.5 x 5.0 cm posterior body intramural fibroid with possible submucosal component. A smaller anterior body intramural fibroid measuring 2.0 x 1.4 x 2.6 cm. The endometrium is thickened and heterogeneous measuring 10 mm in thickness. Echogenic content within the endometrium likely represent blood products. There is a 1.2 x 0.7 x 0.5 cm sac-like structure in the lower endometrium. No yolk sac or fetal pole identified within this structure. This may represent a blighted ovum or abnormally low located gestational sac. There is slight teardrop appearance of the sac. Findings concerning for miscarriage in progress. Clinical correlation recommended. However, in the absence of a definite yolk sac or fetal pole this structure is not  a definitive gestational sac and therefore the possibility of an ectopic pregnancy is not entirely excluded. The ovaries are unremarkable. Small free fluid in the pelvis. IMPRESSION: Sac-like structure in the lower uterus with surrounding blood most concerning for miscarriage in progress. Clinical correlation and follow-up with HCG levels and ultrasound recommended. Electronically Signed   By: Anner Crete M.D.   On: 03/04/2020 01:32   US OB Transvaginal  Result Date: 03/04/2020 CLINICAL DATA:  44 year old pregnant female with vaginal bleeding. LMP: 01/18/2020 corresponding to an estimated gestational age of [redacted] weeks, 4 days. EXAM: OBSTETRIC <14 WK [place] AND TRANSVAGINAL OB US TECHNIQUE: Both transabdominal and transvaginal ultrasound examinations were performed for complete evaluation of the gestation as well as the maternal uterus, adnexal regions, and pelvic cul-de-sac. Transvaginal technique was performed to assess early pregnancy. COMPARISON:  None. FINDINGS: The uterus is anteverted and measures 12.3 x 6.4 x 6.6 cm. The uterus is heterogeneous. There is a 4.6 x 4.5 x 5.0 cm posterior body intramural fibroid with possible submucosal component. A smaller anterior body intramural fibroid measuring 2.0 x 1.4 x 2.6 cm. The endometrium is thickened and heterogeneous measuring 10 mm in thickness. Echogenic content within the endometrium likely represent blood products. There is a 1.2 x 0.7 x 0.5 cm sac-like structure in the lower endometrium. No yolk sac or fetal pole identified within this structure. This may represent a blighted ovum or abnormally low located gestational sac. There is slight teardrop appearance of the sac. Findings concerning for miscarriage in progress. Clinical correlation recommended. However, in the absence of a definite yolk sac or fetal pole this structure is not a definitive gestational sac and therefore the possibility of an ectopic pregnancy is not entirely excluded. The ovaries are  unremarkable. Small free fluid in the pelvis. IMPRESSION: Sac-like structure in the lower uterus with surrounding blood most concerning for miscarriage in progress. Clinical  correlation and follow-up with HCG levels and ultrasound recommended. Electronically Signed   By: Anner Crete M.D.   On: 03/04/2020 01:32    Procedures Procedures (including critical care time)  Medications Ordered in ED Medications - No data to display  ED Course  I have reviewed the triage vital signs and the nursing notes.  Pertinent labs & imaging results that were available during my care of the patient were reviewed by me and considered in my medical decision making (see chart for details).    MDM Rules/Calculators/A&P  44 year old G6P4 presenting to the ED with lower abdominal cramping and vaginal bleeding.  Estimates she is approximately [redacted] weeks gestation based on LMP of 01/18/20.  She is afebrile nontoxic, hemodynamically stable.  Hemoglobin is 8.6, her baseline when compared with last value of 9.6.  hCG 364.  Ultrasound obtained revealing saclike structure in the lower uterus with surrounding blood, concerning for miscarriage.  Pelvic exam was performed, partially open with large amount of blood in the vaginal vault.  No appreciable discharge.  Discussed with patient that constellation of findings is very concerning for miscarriage.  She acknowledged understanding.  She is blood type B+ so not a RhoGam candidate.  Advise she will need close OB/GYN follow-up for repeat hCG and ultrasound.  She will return here for any new/acute changes.  Final Clinical Impression(s) / ED Diagnoses Final diagnoses:  Vaginal bleeding    Rx / DC Orders ED Discharge Orders    None       Larene Pickett, PA-C 03/04/20 [place], Delice Bison, DO 03/04/20 [phone]

## 2020-03-04 NOTE — Discharge Instructions (Signed)
As we discussed today, your symptoms are concerning for miscarriage. We do recommend that you follow-up with OB-GYN as soon as possible-- I would call their office today to have this scheduled.  You will need repeat bloodwork and ultrasound. Return here for any new/acute changes.

## 2020-03-08 ENCOUNTER — Other Ambulatory Visit: Payer: Self-pay

## 2020-03-08 ENCOUNTER — Ambulatory Visit (INDEPENDENT_AMBULATORY_CARE_PROVIDER_SITE_OTHER): Payer: BC Managed Care – PPO | Admitting: Advanced Practice Midwife

## 2020-03-08 ENCOUNTER — Other Ambulatory Visit: Payer: Self-pay | Admitting: Medical

## 2020-03-08 ENCOUNTER — Encounter: Payer: Self-pay | Admitting: Advanced Practice Midwife

## 2020-03-08 VITALS — BP 129/64 | HR 56 | Ht 67.0 in | Wt 211.0 lb

## 2020-03-08 DIAGNOSIS — Z3A01 Less than 8 weeks gestation of pregnancy: Secondary | ICD-10-CM

## 2020-03-08 DIAGNOSIS — O200 Threatened abortion: Secondary | ICD-10-CM | POA: Diagnosis not present

## 2020-03-08 NOTE — Patient Instructions (Signed)
Incomplete Miscarriage A miscarriage is the loss of an unborn baby (fetus) before the 20th week of pregnancy. In an incomplete miscarriage, parts of the fetus or placenta (afterbirth) remain in the body. Most miscarriages happen in the first 3 months of pregnancy. Sometimes, it happens before a woman even knows she is pregnant. Having a miscarriage can be an emotional experience. If you have had a miscarriage, talk with your health care provider about any questions you may have about miscarrying, the grieving process, and your future pregnancy plans. What are the causes? This condition may be caused by:  Problems with the genes or chromosomes that make it impossible for the baby to develop normally. These problems are most often the result of random errors that occur early in development, and are not passed from parent to child (not inherited).  Infection of the cervix or uterus.  Conditions that affect hormone balance in the body.  Problems with the cervix, such as the cervix opening and thinning before pregnancy is at term (cervical insufficiency).  Problems with the uterus, such as a uterus with an abnormal shape, fibroids in the uterus, or problems that were present from birth (congenital abnormalities).  Certain medical conditions.  Smoking, drinking alcohol, or using drugs.  Injury (trauma). Many times, the cause of a miscarriage is not known. What are the signs or symptoms? Symptoms of this condition include:  Vaginal bleeding or spotting, with or without cramps or pain.  Pain or cramping in the abdomen or lower back.  Passing fluid, tissue, or blood clots from the vagina. How is this diagnosed? This condition may be diagnosed based on:  A physical exam.  Ultrasound.  Blood tests.  Urine tests. How is this treated? An incomplete miscarriage may be treated with:  Dilation and curettage (D&C). This is a procedure in which the cervix is stretched open and the lining of  the uterus (endometrium) is scraped to remove any remaining tissue from the pregnancy.  Medicines, such as: ? Antibiotic medicine to treat infection. ? Medicine to help any remaining tissue pass out of your uterus. ? Medicine to reduce (contract) the size of the uterus. These medicines may be given if you have a lot of bleeding. If you have Rh negative blood and your baby was Rh positive, you will need a shot of medicine called Rh immunoglobulinto protect future babies from Rh blood problems. "Rh-negative" and "Rh-positive" refer to whether or not the blood has a specific protein found on the surface of red blood cells (Rh factor). Follow these instructions at home: Medicines   Take over-the-counter and prescription medicines only as told by your health care provider.  If you were prescribed antibiotic medicine, take your antibiotic as told by your health care provider. Do not stop taking the antibiotic even if you start to feel better.  Do not take NSAIDs, such as aspirin and ibuprofen, unless approved by your doctor. These medicines can cause bleeding. Activity  Rest as directed. Ask your health care provider what activities are safe for you.  Have someone help with home and family responsibilities during this time. General instructions  Keep track of the number of sanitary pads you use each day and how soaked (saturated) they are. Write down this information.  Monitor the amount of tissue or blood clots that you pass from your vagina. Save any large amounts of tissue for your health care provider to examine.  Do not use tampons, douche, or have sex until your health care provider   approves.  To help you and your partner with the process of grieving, talk with your health care provider or seek counseling to help cope with the pregnancy loss.  When you are ready, meet with your health care provider to discuss important steps you should take for your health, as well as steps to take in  order to have a healthy pregnancy in the future.  Keep all follow-up visits as told by your health care provider. This is important. Where to find more information  The American Congress of Obstetricians and Gynecologists: www.acog.org  [place] Department of Health and Human Services Office of Women's Health: www.womenshealth.gov Contact a health care provider if:  You have a fever or chills.  You have a foul smelling vaginal discharge. Get help right away if:  You have severe cramps or pain in your back or abdomen.  You pass walnut-sized (or larger) blood clots or tissue from your vagina.  You have heavy bleeding, soaking more than 1 regular sanitary pad in an hour.  You become lightheaded or weak.  You pass out.  You have feelings of sadness that take over your thoughts, or you have thoughts of hurting yourself. Summary  In an incomplete miscarriage, parts of the fetus or placenta (afterbirth) remain in the body.  There are multiple treatment options for an incomplete miscarriage, talk to your health care provider about the best option for you.  Follow your health care provider's instructions for follow-up care.  To help you and your partner with the process of grieving, talk with your health care provider or seek counseling to help cope with the pregnancy loss. This information is not intended to replace advice given to you by your health care provider. Make sure you discuss any questions you have with your health care provider. Document Revised: 09/12/2017 Document Reviewed: 09/12/2016 Elsevier Patient Education  2020 Elsevier Inc.  

## 2020-03-08 NOTE — Progress Notes (Signed)
GYNECOLOGY CLINIC ANNUAL PREVENTATIVE CARE ENCOUNTER NOTE  Subjective:   Nicole Ryan is a 44 y.o. [phone] female here for a followup exam.  Current complaints: cramping and bleeding with a positive pregnancy test.  Seen in [facility] 4 days ago. Had full exam and workup .  HCG was 364 and US showed small ? GS in lower uterine segment.  Was told it was likely a miscarriage.  Pain is much less now.   Denies pelvic pain, problems with intercourse or other gynecologic concerns.    Gynecologic History Patient's last menstrual period was 01/18/2020.   Obstetric History OB History  Gravida Para Term Preterm AB Living  5 3 3   1 3   SAB TAB Ectopic Multiple Live Births  1       3    # Outcome Date GA Lbr Len/2nd Weight Sex Delivery Anes PTL Lv  5 Current           4 SAB 09/03/16 [redacted]w[redacted]d         3 Term 05/03/06 [redacted]w[redacted]d   F Vag-Spont EPI  LIV  2 Term 02/10/00 105w0d   M Vag-Spont EPI  LIV  1 Term 09/13/97 [redacted]w[redacted]d   F Vag-Spont EPI  LIV    Past Medical History:  Diagnosis Date  . Allergy   . GERD (gastroesophageal reflux disease)    heart burn. 2-3 times a week.  . Hypertension     Past Surgical History:  Procedure Laterality Date  . BREAST BIOPSY Bilateral   . breast surg Bilateral    Beign tumors both breast    Current Outpatient Medications on File Prior to Visit  Medication Sig Dispense Refill  . atorvastatin (LIPITOR) 10 MG tablet Take 1 tablet (10 mg total) by mouth daily. 90 tablet 3  . losartan (COZAAR) 100 MG tablet Take 1 tablet (100 mg total) by mouth daily. 90 tablet 3  . metFORMIN (GLUCOPHAGE) 500 MG tablet Take 1 tablet (500 mg total) by mouth daily with breakfast. 30 tablet 3  . metoprolol succinate (TOPROL-XL) 50 MG 24 hr tablet TAKE 1 TABLET BY MOUTH ONCE DAILY --TAKE  WITH  OR  IMMEDIATELY  FOLLOWING  A  MEAL 30 tablet 0  . montelukast (SINGULAIR) 10 MG tablet Take 1 tablet (10 mg total) by mouth at bedtime. (Patient taking differently: Take 10 mg by mouth daily as needed.  Allergies) 90 tablet 3  . Multiple Vitamin (MULTIVITAMIN WITH MINERALS) TABS tablet Take 1 tablet by mouth daily.    Marland Kitchen albuterol (VENTOLIN HFA) 108 (90 Base) MCG/ACT inhaler Inhale 2 puffs into the lungs every 6 (six) hours as needed. (Patient not taking: Reported on 03/08/2020) 18 g 0  . ciprofloxacin (CIPRO) 250 MG tablet Take 1 tablet (250 mg total) by mouth 2 (two) times daily. (Patient not taking: Reported on 03/04/2020) 6 tablet 0  . fluconazole (DIFLUCAN) 150 MG tablet 1 po x1, may repeat in 3 days prn (Patient not taking: Reported on 03/04/2020) 2 tablet 0  . metroNIDAZOLE (FLAGYL) 500 MG tablet Take 1 tablet (500 mg total) by mouth 2 (two) times daily. (Patient not taking: Reported on 03/04/2020) 14 tablet 0   No current facility-administered medications on file prior to visit.    No Known Allergies  Social History   Socioeconomic History  . Marital status: Single    Spouse name: Not on file  . Number of children: Not on file  . Years of education: Not on file  . Highest education level: Not  on file  Occupational History  . Occupation: Control and instrumentation engineer  Tobacco Use  . Smoking status: Never Smoker  . Smokeless tobacco: Never Used  Vaping Use  . Vaping Use: Never used  Substance and Sexual Activity  . Alcohol use: Not Currently  . Drug use: Never  . Sexual activity: Not Currently    Birth control/protection: None  Other Topics Concern  . Not on file  Social History Narrative  . Not on file   Social Determinants of Health   Financial Resource Strain:   . Difficulty of Paying Living Expenses:   Food Insecurity:   . Worried About Charity fundraiser in the Last Year:   . Arboriculturist in the Last Year:   Transportation Needs:   . Film/video editor (Medical):   Marland Kitchen Lack of Transportation (Non-Medical):   Physical Activity:   . Days of Exercise per Week:   . Minutes of Exercise per Session:   Stress:   . Feeling of Stress :   Social Connections:   . Frequency  of Communication with Friends and Family:   . Frequency of Social Gatherings with Friends and Family:   . Attends Religious Services:   . Active Member of Clubs or Organizations:   . Attends Archivist Meetings:   Marland Kitchen Marital Status:   Intimate Partner Violence:   . Fear of Current or Ex-Partner:   . Emotionally Abused:   Marland Kitchen Physically Abused:   . Sexually Abused:     Family History  Problem Relation Age of Onset  . Aneurysm Mother   . Hypertension Mother   . Diabetes Mother     The following portions of the patient's history were reviewed and updated as appropriate: allergies, current medications, past family history, past medical history, past social history, past surgical history and problem list.  Review of Systems Pertinent items noted in HPI and remainder of comprehensive ROS otherwise negative.   Objective:  BP 129/64   Pulse (!) 56   Ht 5\' 7"  (1.702 m)   Wt 211 lb (95.7 kg)   LMP 01/18/2020   BMI 33.05 kg/m  CONSTITUTIONAL: Well-developed, well-nourished female in no acute distress.  SKIN: Skin is warm and dry. No rash noted. Not diaphoretic. No erythema. No pallor. [place]: Alert and oriented to person, place, and time.  PSYCHIATRIC: Normal mood and affect. Normal behavior. Normal judgment and thought content. CARDIOVASCULAR: Normal heart rate noted, regular rhythm RESPIRATORY: Effort and breath sounds normal, no problems with respiration noted. ABDOMEN: No tenderness, rebound or guarding.  PELVIC: Deferred due to recent exam and improving symptoms    Assessment:  Pregnancy at [redacted]w[redacted]d by LMP Bleeding in early pregnancy Cramping in early pregnancy Threatened abortion   Plan:  Repeat HCG and CBC today Will follow up results  and manage accordingly. If HCG goes up, will repeat US to rule out ectopic and eval pregnancy status If HCG goes down, will offer expectant management, Cytotec or D&C  Patient states she is resolved that this is a miscarriage.   Options reviewed  Please refer to After Visit Summary for other counseling recommendations.   Talasia Saulter L, [place] for [facility]

## 2020-03-09 ENCOUNTER — Ambulatory Visit: Payer: BC Managed Care – PPO | Admitting: Medical

## 2020-03-09 ENCOUNTER — Telehealth: Payer: Self-pay

## 2020-03-09 LAB — CBC
Hematocrit: 29.1 % — ABNORMAL LOW (ref 34.0–46.6)
Hemoglobin: 8.7 g/dL — ABNORMAL LOW (ref 11.1–15.9)
MCH: 18.2 pg — ABNORMAL LOW (ref 26.6–33.0)
MCHC: 29.9 g/dL — ABNORMAL LOW (ref 31.5–35.7)
MCV: 61 fL — ABNORMAL LOW (ref 79–97)
Platelets: 597 10*3/uL — ABNORMAL HIGH (ref 150–450)
RBC: 4.79 x10E6/uL (ref 3.77–5.28)
RDW: 18.2 % — ABNORMAL HIGH (ref 11.7–15.4)
WBC: 9.1 10*3/uL (ref 3.4–10.8)

## 2020-03-09 LAB — BETA HCG QUANT (REF LAB): hCG Quant: 18 m[IU]/mL

## 2020-03-09 NOTE — Telephone Encounter (Signed)
Called pt to discuss HCG levels. Pt made aware that her HCG level is 18 and she probably passed the pregnancy tissue. Pt is scheduled to have a repeat HCG drawn on 03/18/20 and SAB f/u on 03/25/20. Understanding was voiced.  Tilley Faeth l Blayton Huttner, CMA

## 2020-03-09 NOTE — Telephone Encounter (Signed)
-----   Message from Seabron Spates, CNM sent at 03/09/2020  6:31 AM EDT ----- Regarding: miscarriage followup HCG level went from 300s in the ED to 18  Can you call her and tell her we just need one more HCG next week to follow to 0 This means she has already probably passed the pregnancy tissue.   Followup 2-3 wks for post SAB visit  Lelan Pons

## 2020-03-18 ENCOUNTER — Ambulatory Visit (INDEPENDENT_AMBULATORY_CARE_PROVIDER_SITE_OTHER): Payer: BC Managed Care – PPO

## 2020-03-18 ENCOUNTER — Other Ambulatory Visit: Payer: Self-pay

## 2020-03-18 DIAGNOSIS — O200 Threatened abortion: Secondary | ICD-10-CM

## 2020-03-18 NOTE — Progress Notes (Signed)
Pt was sent to the lab for repeat Beta HCG. Hawk Mones l Brytney Somes, CMA

## 2020-03-19 LAB — BETA HCG QUANT (REF LAB): hCG Quant: <1 m[IU]/mL

## 2020-03-23 ENCOUNTER — Other Ambulatory Visit: Payer: Self-pay | Admitting: Medical

## 2020-03-25 ENCOUNTER — Ambulatory Visit: Payer: BC Managed Care – PPO | Admitting: Family Medicine

## 2020-03-31 ENCOUNTER — Other Ambulatory Visit: Payer: Self-pay

## 2020-03-31 ENCOUNTER — Ambulatory Visit (INDEPENDENT_AMBULATORY_CARE_PROVIDER_SITE_OTHER): Payer: BC Managed Care – PPO | Admitting: Family Medicine

## 2020-03-31 ENCOUNTER — Encounter: Payer: Self-pay | Admitting: Family Medicine

## 2020-03-31 VITALS — BP 123/78 | HR 100 | Ht 67.0 in | Wt 208.0 lb

## 2020-03-31 DIAGNOSIS — N920 Excessive and frequent menstruation with regular cycle: Secondary | ICD-10-CM | POA: Diagnosis not present

## 2020-03-31 DIAGNOSIS — O039 Complete or unspecified spontaneous abortion without complication: Secondary | ICD-10-CM | POA: Diagnosis not present

## 2020-03-31 MED ORDER — NORETHINDRONE 0.35 MG PO TABS
1.0000 | ORAL_TABLET | Freq: Every day | ORAL | 3 refills | Status: DC
Start: 2020-03-31 — End: 2020-05-25

## 2020-03-31 NOTE — Progress Notes (Signed)
Patient presents for follow up miscarriage. Nicole Alu RN

## 2020-03-31 NOTE — Progress Notes (Signed)
   Subjective:    Patient ID: Nicole Ryan, female    DOB: Mar 06, 1976, 44 y.o.   MRN: [id number]  HPI Patient seen for follow up of SAB. Has stopped bleeding and HCG counts are back to normal. Mood is ok. This was not a planned pregnancy.   Has history of HTN, DM2, hyperlipidemia and is on meds for all of these.  Review of Systems     Objective:   Physical Exam Vitals reviewed.  Constitutional:      Appearance: Normal appearance.  Cardiovascular:     Rate and Rhythm: Normal rate.  Abdominal:     General: Abdomen is flat.     Palpations: Abdomen is soft.  Skin:    Capillary Refill: Capillary refill takes less than 2 seconds.  Neurological:     Mental Status: She is alert.  Psychiatric:        Mood and Affect: Mood normal.        Thought Content: Thought content normal.        Judgment: Judgment normal.        Assessment & Plan:  1. SAB (spontaneous abortion) Micronor for contraception as high cardiovascular risk with comorbidities. Is not interested in becoming pregnant again.  2. Menorrhagia Would like to have something to eliminate menses. Discussed medical vs surgical. Would like to discuss surgical options. Will schedule with Dr Ihor Dow.

## 2020-04-20 ENCOUNTER — Other Ambulatory Visit: Payer: Self-pay | Admitting: Medical

## 2020-05-25 ENCOUNTER — Ambulatory Visit (INDEPENDENT_AMBULATORY_CARE_PROVIDER_SITE_OTHER): Payer: BC Managed Care – PPO | Admitting: Obstetrics & Gynecology

## 2020-05-25 ENCOUNTER — Other Ambulatory Visit: Payer: Self-pay

## 2020-05-25 ENCOUNTER — Encounter: Payer: Self-pay | Admitting: Obstetrics & Gynecology

## 2020-05-25 VITALS — BP 131/67 | HR 74 | Wt 212.0 lb

## 2020-05-25 DIAGNOSIS — N920 Excessive and frequent menstruation with regular cycle: Secondary | ICD-10-CM

## 2020-05-25 MED ORDER — MEGESTROL ACETATE 40 MG PO TABS: 40.0000 mg | ORAL_TABLET | Freq: Two times a day (BID) | ORAL | 2 refills | Status: DC

## 2020-05-25 NOTE — Progress Notes (Signed)
Patient wants to discuss surgical management of heavy periods. Kathrene Alu RN   Last pap 11-07-17 WNL

## 2020-05-25 NOTE — Patient Instructions (Signed)
Levonorgestrel intrauterine device (IUD) What is this medicine? LEVONORGESTREL IUD (LEE voe nor jes trel) is a contraceptive (birth control) device. The device is placed inside the uterus by a healthcare professional. It is used to prevent pregnancy. This device can also be used to treat heavy bleeding that occurs during your period. This medicine may be used for other purposes; ask your health care provider or pharmacist if you have questions. COMMON BRAND NAME(S): Kyleena, LILETTA, Mirena, Skyla What should I tell my health care provider before I take this medicine? They need to know if you have any of these conditions:  abnormal Pap smear  cancer of the breast, uterus, or cervix  diabetes  endometritis  genital or pelvic infection now or in the past  have more than one sexual partner or your partner has more than one partner  heart disease  history of an ectopic or tubal pregnancy  immune system problems  IUD in place  liver disease or tumor  problems with blood clots or take blood-thinners  seizures  use intravenous drugs  uterus of unusual shape  vaginal bleeding that has not been explained  an unusual or allergic reaction to levonorgestrel, other hormones, silicone, or polyethylene, medicines, foods, dyes, or preservatives  pregnant or trying to get pregnant  breast-feeding How should I use this medicine? This device is placed inside the uterus by a health care professional. Talk to your pediatrician regarding the use of this medicine in children. Special care may be needed. Overdosage: If you think you have taken too much of this medicine contact a poison control center or emergency room at once. NOTE: This medicine is only for you. Do not share this medicine with others. What if I miss a dose? This does not apply. Depending on the brand of device you have inserted, the device will need to be replaced every 3 to 6 years if you wish to continue using this type  of birth control. What may interact with this medicine? Do not take this medicine with any of the following medications:  amprenavir  bosentan  fosamprenavir This medicine may also interact with the following medications:  aprepitant  armodafinil  barbiturate medicines for inducing sleep or treating seizures  bexarotene  boceprevir  griseofulvin  medicines to treat seizures like carbamazepine, ethotoin, felbamate, oxcarbazepine, phenytoin, topiramate  modafinil  pioglitazone  rifabutin  rifampin  rifapentine  some medicines to treat HIV infection like atazanavir, efavirenz, indinavir, lopinavir, nelfinavir, tipranavir, ritonavir  St. John's wort  warfarin This list may not describe all possible interactions. Give your health care provider a list of all the medicines, herbs, non-prescription drugs, or dietary supplements you use. Also tell them if you smoke, drink alcohol, or use illegal drugs. Some items may interact with your medicine. What should I watch for while using this medicine? Visit your doctor or health care professional for regular check ups. See your doctor if you or your partner has sexual contact with others, becomes HIV positive, or gets a sexual transmitted disease. This product does not protect you against HIV infection (AIDS) or other sexually transmitted diseases. You can check the placement of the IUD yourself by reaching up to the top of your vagina with clean fingers to feel the threads. Do not pull on the threads. It is a good habit to check placement after each menstrual period. Call your doctor right away if you feel more of the IUD than just the threads or if you cannot feel the threads at   all. The IUD may come out by itself. You may become pregnant if the device comes out. If you notice that the IUD has come out use a backup birth control method like condoms and call your health care provider. Using tampons will not change the position of the  IUD and are okay to use during your period. This IUD can be safely scanned with magnetic resonance imaging (MRI) only under specific conditions. Before you have an MRI, tell your healthcare provider that you have an IUD in place, and which type of IUD you have in place. What side effects may I notice from receiving this medicine? Side effects that you should report to your doctor or health care professional as soon as possible:  allergic reactions like skin rash, itching or hives, swelling of the face, lips, or tongue  fever, flu-like symptoms  genital sores  high blood pressure  no menstrual period for 6 weeks during use  pain, swelling, warmth in the leg  pelvic pain or tenderness  severe or sudden headache  signs of pregnancy  stomach cramping  sudden shortness of breath  trouble with balance, talking, or walking  unusual vaginal bleeding, discharge  yellowing of the eyes or skin Side effects that usually do not require medical attention (report to your doctor or health care professional if they continue or are bothersome):  acne  breast pain  change in sex drive or performance  changes in weight  cramping, dizziness, or faintness while the device is being inserted  headache  irregular menstrual bleeding within first 3 to 6 months of use  nausea This list may not describe all possible side effects. Call your doctor for medical advice about side effects. You may report side effects to FDA at 1-800-FDA-1088. Where should I keep my medicine? This does not apply. NOTE: This sheet is a summary. It may not cover all possible information. If you have questions about this medicine, talk to your doctor, pharmacist, or health care provider.  2020 Elsevier/Gold Standard (2018-06-17 13:22:01)  

## 2020-05-25 NOTE — Progress Notes (Signed)
History:  44 y.o. [id number] here today for pt reports that she has 7 days cycles every month. These are not new sx. She recently has a miscarriage. She reports that prior to that her cycles were heavy with clots. She reports that she has fatigue with her cycles. She wants definitive tx. She was prev on a med that she took ONLY with her cycles.    The following portions of the patient's history were reviewed and updated as appropriate: allergies, current medications, past family history, past medical history, past social history, past surgical history and problem list.  Review of Systems:  Pertinent items are noted in HPI.    Objective:  Physical Exam Blood pressure 131/67, pulse 74, weight 212 lb (96.2 kg), unknown if currently breastfeeding.  CONSTITUTIONAL: Well-developed, well-nourished female in no acute distress.  HENT:  Normocephalic, atraumatic EYES: Conjunctivae and EOM are normal. No scleral icterus.  NECK: Normal range of motion SKIN: Skin is warm and dry. No rash noted. Not diaphoretic.No pallor. [place]: Alert and oriented to person, place, and time. Normal coordination.  Pelvic exam not repeated.    Labs and Imaging 03/04/2020 CLINICAL DATA:  44 year old pregnant female with vaginal bleeding. LMP: 01/18/2020 corresponding to an estimated gestational age of [redacted] weeks, 4 days.  EXAM: OBSTETRIC <14 WK [place] AND TRANSVAGINAL OB US  TECHNIQUE: Both transabdominal and transvaginal ultrasound examinations were performed for complete evaluation of the gestation as well as the maternal uterus, adnexal regions, and pelvic cul-de-sac. Transvaginal technique was performed to assess early pregnancy.  COMPARISON:  None.  FINDINGS: The uterus is anteverted and measures 12.3 x 6.4 x 6.6 cm. The uterus is heterogeneous. There is a 4.6 x 4.5 x 5.0 cm posterior body intramural fibroid with possible submucosal component. A smaller anterior body intramural fibroid measuring 2.0 x 1.4 x  2.6 cm.  The endometrium is thickened and heterogeneous measuring 10 mm in thickness. Echogenic content within the endometrium likely represent blood products. There is a 1.2 x 0.7 x 0.5 cm sac-like structure in the lower endometrium. No yolk sac or fetal pole identified within this structure. This may represent a blighted ovum or abnormally low located gestational sac. There is slight teardrop appearance of the sac. Findings concerning for miscarriage in progress. Clinical correlation recommended.  However, in the absence of a definite yolk sac or fetal pole this structure is not a definitive gestational sac and therefore the possibility of an ectopic pregnancy is not entirely excluded.  The ovaries are unremarkable.  Small free fluid in the pelvis.  IMPRESSION: Sac-like structure in the lower uterus with surrounding blood most concerning for miscarriage in progress. Clinical correlation and follow-up with HCG levels and ultrasound recommended.   Assessment & Plan:  Menorrhagia with reg cycle. Pt denies intermenstrual bleeding.    reviewed options for management. Reviewed conservative and surgival options. Pt will begin with LnIUD. She is about to have her cycle so we will place it AFTER her next menses.   Total face-to-face time with patient was 25 min.  Greater than 50% was spent in counseling and coordination of care with the patient.   Albie Bazin L. Harraway-Smith, M.D., Cherlynn June

## 2020-05-28 ENCOUNTER — Other Ambulatory Visit: Payer: Self-pay | Admitting: Medical

## 2020-06-15 ENCOUNTER — Ambulatory Visit: Payer: BC Managed Care – PPO | Admitting: Obstetrics & Gynecology

## 2020-06-24 ENCOUNTER — Other Ambulatory Visit: Payer: Self-pay | Admitting: Medical

## 2020-07-09 ENCOUNTER — Other Ambulatory Visit: Payer: Self-pay | Admitting: Medical

## 2020-08-19 ENCOUNTER — Other Ambulatory Visit: Payer: Self-pay | Admitting: Medical

## 2020-08-23 ENCOUNTER — Other Ambulatory Visit: Payer: Self-pay | Admitting: Medical

## 2020-08-24 MED ORDER — METOPROLOL SUCCINATE ER 50 MG PO TB24: 50.0000 mg | ORAL_TABLET | Freq: Every day | ORAL | 0 refills | Status: DC

## 2020-08-26 ENCOUNTER — Ambulatory Visit: Payer: BC Managed Care – PPO | Admitting: Medical

## 2020-08-26 ENCOUNTER — Other Ambulatory Visit: Payer: Self-pay

## 2020-08-26 ENCOUNTER — Encounter: Payer: Self-pay | Admitting: Medical

## 2020-08-26 VITALS — BP 120/82 | HR 80 | Temp 99.1°F | Resp 18 | Ht 67.0 in | Wt 202.2 lb

## 2020-08-26 DIAGNOSIS — J301 Allergic rhinitis due to pollen: Secondary | ICD-10-CM | POA: Diagnosis not present

## 2020-08-26 DIAGNOSIS — I10 Essential (primary) hypertension: Secondary | ICD-10-CM | POA: Diagnosis not present

## 2020-08-26 DIAGNOSIS — J3489 Other specified disorders of nose and nasal sinuses: Secondary | ICD-10-CM

## 2020-08-26 DIAGNOSIS — E119 Type 2 diabetes mellitus without complications: Secondary | ICD-10-CM | POA: Diagnosis not present

## 2020-08-26 DIAGNOSIS — M79671 Pain in right foot: Secondary | ICD-10-CM | POA: Diagnosis not present

## 2020-08-26 DIAGNOSIS — F439 Reaction to severe stress, unspecified: Secondary | ICD-10-CM | POA: Diagnosis not present

## 2020-08-26 LAB — COMPREHENSIVE METABOLIC PANEL
ALT: 43 U/L — ABNORMAL HIGH (ref 0–35)
AST: 42 U/L — ABNORMAL HIGH (ref 0–37)
Albumin: 4.2 g/dL (ref 3.5–5.2)
Alkaline Phosphatase: 94 U/L (ref 39–117)
BUN: 10 mg/dL (ref 6–23)
CO2: 24 mEq/L (ref 19–32)
Calcium: 10.2 mg/dL (ref 8.4–10.5)
Chloride: 103 mEq/L (ref 96–112)
Creatinine, Ser: 0.77 mg/dL (ref 0.40–1.20)
GFR: 94.9 mL/min (ref >60.00)
Glucose, Bld: 221 mg/dL — ABNORMAL HIGH (ref 70–99)
Potassium: 4.5 mEq/L (ref 3.5–5.1)
Sodium: 135 mEq/L (ref 135–145)
Total Bilirubin: 0.6 mg/dL (ref 0.2–1.2)
Total Protein: 7.5 g/dL (ref 6.0–8.3)

## 2020-08-26 LAB — LIPID PANEL
Cholesterol: 118 mg/dL (ref 0–200)
HDL: 49.3 mg/dL (ref >39.00)
LDL Cholesterol: 54 mg/dL (ref 0–99)
NonHDL: 68.93
Total CHOL/HDL Ratio: 2
Triglycerides: 75 mg/dL (ref 0.0–149.0)
VLDL: 15 mg/dL (ref 0.0–40.0)

## 2020-08-26 LAB — HEMOGLOBIN A1C: Hgb A1c MFr Bld: 9.1 % — ABNORMAL HIGH (ref 4.6–6.5)

## 2020-08-26 MED ORDER — AZELASTINE HCL 0.1 % NA SOLN: 2.0000 | Freq: Two times a day (BID) | NASAL | 12 refills | Status: DC

## 2020-08-26 MED ORDER — AZITHROMYCIN 250 MG PO TABS
ORAL_TABLET | ORAL | 0 refills | Status: DC
Start: 2020-08-26 — End: 2021-01-04

## 2020-08-26 NOTE — Progress Notes (Signed)
Subjective:    Patient ID: Nicole Ryan, female    DOB: 1976-08-08, 45 y.o.   MRN: [id number]  HPI  Pt in stating she has been very stressed. She states not having a vehicle major cause of stress recently for past 2 months.  Pt just got new car around [place].  Also some family issues that persist cause. Pt states has been having some mild depression.  Pt bp is well controlled today. Pt does not check her bp.  Pt had some rt side frontal pain for about a month. Pain comes and goes. Not every day. Recent some nasal congestion. She just started singulair. Pt is on flonase nasal spray. Some intermittent allergies type symptoms year round. Frontal sinus pain/ha.  Pt also needs form filled out. 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  Hx of htn- bp well controlled. On toprol xl 50 mg. Hx of diabetes.Marland Kitchen last a1c was 6.2 on metformin.  lmp- presently.  Review of Systems  Constitutional: Negative for chills and fatigue.  HENT: Positive for congestion, postnasal drip and sinus pressure.   Respiratory: Negative for chest tightness, shortness of breath and wheezing.   Cardiovascular: Negative for chest pain and palpitations.  Gastrointestinal: Negative for abdominal pain.  Musculoskeletal: Negative for back pain.  Skin: Negative for rash.  Neurological: Negative for dizziness, speech difficulty, weakness, numbness and headaches.  Hematological: Negative for adenopathy. Does not bruise/bleed easily.  Psychiatric/Behavioral: Negative for behavioral problems and confusion.     Past Medical History:  Diagnosis Date  . Allergy   . GERD (gastroesophageal reflux disease)    heart burn. 2-3 times a week.  . Hypertension      Social History   Socioeconomic History  . Marital status: Single    Spouse name: Not on file  . Number of children: Not on file  . Years of education: Not on file  . Highest education level: Not on file  Occupational  History  . Occupation: Control and instrumentation engineer  Tobacco Use  . Smoking status: Never Smoker  . Smokeless tobacco: Never Used  Vaping Use  . Vaping Use: Never used  Substance and Sexual Activity  . Alcohol use: Not Currently  . Drug use: Never  . Sexual activity: Not Currently    Birth control/protection: None  Other Topics Concern  . Not on file  Social History Narrative  . Not on file   Social Determinants of Health   Financial Resource Strain: Not on file  Food Insecurity: Not on file  Transportation Needs: Not on file  Physical Activity: Not on file  Stress: Not on file  Social Connections: Not on file  Intimate Partner Violence: Not on file    Past Surgical History:  Procedure Laterality Date  . BREAST BIOPSY Bilateral   . breast surg Bilateral    Beign tumors both breast    Family History  Problem Relation Age of Onset  . Aneurysm Mother   . Hypertension Mother   . Diabetes Mother     No Known Allergies  Current Outpatient Medications on File Prior to Visit  Medication Sig Dispense Refill  . albuterol (VENTOLIN HFA) 108 (90 Base) MCG/ACT inhaler Inhale 2 puffs into the lungs every 6 (six) hours as needed. 18 g 0  . atorvastatin (LIPITOR) 10 MG tablet Take 1 tablet (10 mg total) by mouth daily. 90 tablet 3  . fluticasone (FLONASE) 50 MCG/ACT nasal spray Use 2 spray(s) in each nostril once  daily 16 g 0  . losartan (COZAAR) 100 MG tablet Take 1 tablet by mouth once daily 90 tablet 0  . megestrol (MEGACE) 40 MG tablet Take 1 tablet (40 mg total) by mouth 2 (two) times daily. Can increase to two tablets twice a day in the event of heavy bleeding 60 tablet 2  . metFORMIN (GLUCOPHAGE) 500 MG tablet Take 1 tablet by mouth once daily with breakfast 90 tablet 0  . metoprolol succinate (TOPROL-XL) 50 MG 24 hr tablet Take 1 tablet (50 mg total) by mouth daily. Take with or immediately following a meal. 30 tablet 0  . metroNIDAZOLE (FLAGYL) 500 MG tablet Take 1 tablet (500 mg  total) by mouth 2 (two) times daily. 14 tablet 0  . montelukast (SINGULAIR) 10 MG tablet Take 1 tablet (10 mg total) by mouth at bedtime. (Patient taking differently: Take 10 mg by mouth daily as needed. Allergies) 90 tablet 3  . Multiple Vitamin (MULTIVITAMIN WITH MINERALS) TABS tablet Take 1 tablet by mouth daily.    . ciprofloxacin (CIPRO) 250 MG tablet Take 1 tablet (250 mg total) by mouth 2 (two) times daily. (Patient not taking: No sig reported) 6 tablet 0  . fluconazole (DIFLUCAN) 150 MG tablet 1 po x1, may repeat in 3 days prn (Patient not taking: No sig reported) 2 tablet 0   No current facility-administered medications on file prior to visit.    BP 120/82   Pulse 80   Temp 99.1 F (37.3 C) (Oral)   Resp 18   Ht 5\' 7"  (1.702 m)   Wt 202 lb 3.2 oz (91.7 kg)   SpO2 99%   BMI 31.67 kg/m       Objective:   Physical Exam General Mental Status- Alert. General Appearance- Not in acute distress.   Skin General: Color- Normal Color. Moisture- Normal Moisture.  Neck Carotid Arteries- Normal color. Moisture- Normal Moisture. No carotid bruits. No JVD.  Chest and Lung Exam Auscultation: Breath Sounds:-Normal.  Cardiovascular Auscultation:Rythm- Regular. Murmurs & Other Heart Sounds:Auscultation of the heart reveals- No Murmurs.  Neurologic Cranial Nerve exam:- CN III-XII intact(No nystagmus), symmetric smile. Finger to Nose:- Normal/Intact Strength:- 5/5 equal and symmetric strength both upper and lower extremities.  Lower ext- no pedal edema. calfs symmetric. Rt foot- points to bottom asepct arch as soure of pain.  heent-  frontal and maxiallary sinus pressure pain mostly over ethmoid sinuses. Assessment & Plan:  Recent sinus pressure for 1 month intermittently along with history of allergic rhinitis signs and symptoms year-round per history.  Recent mild nasal congestion and postnasal drainage associated with the pressure.  Continue montelukast and Flonase nasal  spray.  We will add Astelin nasal spray and azithromycin antibiotic.  If pressure persists then might need CT scanning of the sinuses.  For right foot pain we will get x-ray today.  Recommend that you get full-length doctor sholl feet pad to use daily.  Hypertension currently controlled.  Continue Toprol 50 mg daily.  History of diabetes and last A1c showed good control.  Continue metformin.  We will get CMP, A1c and lipid panel today.  Recent describes stress but on depression questionnaire and anxiety questionnaire your scores were low.  If you feel mood worsening or more anxious please let us know.  Could prescribe medication if needed.  Follow-up in 10 days or as needed.  [name], PA-C

## 2020-08-26 NOTE — Patient Instructions (Addendum)
Recent sinus pressure for 1 month intermittently along with history of allergic rhinitis signs and symptoms year-round per history.  Recent mild nasal congestion and postnasal drainage associated with the pressure.  Continue montelukast and Flonase nasal spray.  We will add Astelin nasal spray and azithromycin antibiotic.  If pressure persists then might need CT scanning of the sinuses.  For right foot pain we will get x-ray today.  Recommend that you get full-length doctor sholl feet pad to use daily.  Hypertension currently controlled.  Continue Toprol 50 mg daily.  History of diabetes and last A1c showed good control.  Continue metformin.  We will get CMP, A1c and lipid panel today.  Recent describes stress but on depression questionnaire and anxiety questionnaire your scores were low.  If you feel mood worsening or more anxious please let us know.  Could prescribe medication if needed.  Follow-up in 10 days or as needed.

## 2020-08-27 ENCOUNTER — Telehealth: Payer: Self-pay | Admitting: Medical

## 2020-08-27 MED ORDER — METFORMIN HCL 1000 MG PO TABS
1000.0000 mg | ORAL_TABLET | Freq: Two times a day (BID) | ORAL | 3 refills | Status: DC
Start: 2020-08-27 — End: 2021-08-29

## 2020-08-27 NOTE — Telephone Encounter (Signed)
Rx metformin sent to pharmacy. 

## 2020-08-29 ENCOUNTER — Ambulatory Visit (HOSPITAL_BASED_OUTPATIENT_CLINIC_OR_DEPARTMENT_OTHER)
Admission: RE | Admit: 2020-08-29 | Discharge: 2020-08-29 | Disposition: A | Discharge status: Home / Self Care | Payer: BC Managed Care – PPO | Source: Ambulatory Visit | Attending: Medical | Admitting: Medical

## 2020-08-29 ENCOUNTER — Encounter: Payer: BC Managed Care – PPO | Admitting: Medical

## 2020-08-29 ENCOUNTER — Other Ambulatory Visit: Payer: Self-pay

## 2020-08-29 DIAGNOSIS — M79671 Pain in right foot: Secondary | ICD-10-CM | POA: Insufficient documentation

## 2021-01-04 ENCOUNTER — Ambulatory Visit (INDEPENDENT_AMBULATORY_CARE_PROVIDER_SITE_OTHER): Payer: Self-pay | Admitting: Family

## 2021-01-04 ENCOUNTER — Other Ambulatory Visit: Payer: Self-pay

## 2021-01-04 VITALS — BP 180/89 | HR 90 | Temp 98.5°F | Ht 67.0 in | Wt 212.2 lb

## 2021-01-04 DIAGNOSIS — I10 Essential (primary) hypertension: Secondary | ICD-10-CM

## 2021-01-04 DIAGNOSIS — R110 Nausea: Secondary | ICD-10-CM

## 2021-01-04 LAB — CBC WITH DIFFERENTIAL/PLATELET
Basophils Absolute: 0 10*3/uL (ref 0.0–0.1)
Basophils Relative: 0.6 % (ref 0.0–3.0)
Eosinophils Absolute: 0.1 10*3/uL (ref 0.0–0.7)
Eosinophils Relative: 1.2 % (ref 0.0–5.0)
HCT: 37.8 % (ref 36.0–46.0)
Hemoglobin: 12 g/dL (ref 12.0–15.0)
Lymphocytes Relative: 31.7 % (ref 12.0–46.0)
Lymphs Abs: 2.4 10*3/uL (ref 0.7–4.0)
MCHC: 31.7 g/dL (ref 30.0–36.0)
MCV: 77.6 fl — ABNORMAL LOW (ref 78.0–100.0)
Monocytes Absolute: 0.6 10*3/uL (ref 0.1–1.0)
Monocytes Relative: 7.8 % (ref 3.0–12.0)
Neutro Abs: 4.5 10*3/uL (ref 1.4–7.7)
Neutrophils Relative %: 58.7 % (ref 43.0–77.0)
Platelets: 512 10*3/uL — ABNORMAL HIGH (ref 150.0–400.0)
RBC: 4.87 Mil/uL (ref 3.87–5.11)
RDW: 13.8 % (ref 11.5–15.5)
WBC: 7.6 10*3/uL (ref 4.0–10.5)

## 2021-01-04 LAB — HEPATIC FUNCTION PANEL
ALT: 27 U/L (ref 0–35)
AST: 33 U/L (ref 0–37)
Albumin: 3.5 g/dL (ref 3.5–5.2)
Alkaline Phosphatase: 94 U/L (ref 39–117)
Bilirubin, Direct: 0.1 mg/dL (ref 0.0–0.3)
Total Bilirubin: 0.4 mg/dL (ref 0.2–1.2)
Total Protein: 6.3 g/dL (ref 6.0–8.3)

## 2021-01-04 MED ORDER — METOPROLOL SUCCINATE ER 50 MG PO TB24: 50.0000 mg | ORAL_TABLET | Freq: Every day | ORAL | 0 refills | Status: DC

## 2021-01-04 MED ORDER — LOSARTAN POTASSIUM 100 MG PO TABS: 1.0000 | ORAL_TABLET | Freq: Every day | ORAL | 0 refills | Status: DC

## 2021-01-04 MED ORDER — ATORVASTATIN CALCIUM 10 MG PO TABS: 10.0000 mg | ORAL_TABLET | Freq: Every day | ORAL | 0 refills | Status: DC

## 2021-01-04 MED ORDER — ONDANSETRON HCL 4 MG PO TABS: 4.0000 mg | ORAL_TABLET | Freq: Three times a day (TID) | ORAL | 0 refills | Status: DC | PRN

## 2021-01-04 NOTE — Assessment & Plan Note (Signed)
Uncontrolled because she could not afford her medication. She has now obtained health insurance.  Continue metoprolol 50mg  once daily and resume losartan 100mg  today. Elevated blood pressure is likely cause for her headaches.

## 2021-01-04 NOTE — Patient Instructions (Signed)
Please complete lab work prior to leaving. Restart metoprolol for blood pressure.  You may use zofran as needed for nausea. Please let me know if your symptoms worsen or if not improved in 4 days.

## 2021-01-04 NOTE — Assessment & Plan Note (Addendum)
Will Check CBC, LFT, urine hcg.  ? Viral gastroenteritis. Will rx with zofran 4mg  PO Q8 hrs prn. She is advised to call if symptoms worsen or if not improved in 4 days.

## 2021-01-04 NOTE — Progress Notes (Signed)
Subjective:   By signing my name below, I, Nicole Ryan, attest that this documentation has been prepared under the direction and in the presence of [name]. 01/04/2021      Patient ID: Nicole Ryan, female    DOB: 1975/11/09, 45 y.o.   MRN: [id number]  No chief complaint on file.   HPI Patient is in today for a office visit. She complains of Nausea and headaches for the past couple of days. She feels nausea in the morning after waking up but her symptoms go away after eating. Recently her nausea feels worse after just smelling food. She also complains of abdominal pain. Her last menstrual cycle was last week. She denies having any dysuria and vomiting at this time. She has her gallbladder at this time.  Glucose- She is requesting a refill on 1000 mg metformin daily PO. Hyperlipidemia- She requesting a refill on 10 mg atorvastatin daily PO. Hypertension- She recently switched insurance and could not get a refill on her blood pressure medication. She only takes 100 mg losartan daily PO. She stopped taking metoprolol succinate daily PO.  Past Medical History:  Diagnosis Date  . Allergy   . GERD (gastroesophageal reflux disease)    heart burn. 2-3 times a week.  . Hypertension     Past Surgical History:  Procedure Laterality Date  . BREAST BIOPSY Bilateral   . breast surg Bilateral    Beign tumors both breast    Family History  Problem Relation Age of Onset  . Aneurysm Mother   . Hypertension Mother   . Diabetes Mother     Social History   Socioeconomic History  . Marital status: Single    Spouse name: Not on file  . Number of children: Not on file  . Years of education: Not on file  . Highest education level: Not on file  Occupational History  . Occupation: Control and instrumentation engineer  Tobacco Use  . Smoking status: Never Smoker  . Smokeless tobacco: Never Used  Vaping Use  . Vaping Use: Never used  Substance and Sexual Activity  . Alcohol use: Not  Currently  . Drug use: Never  . Sexual activity: Not Currently    Birth control/protection: None  Other Topics Concern  . Not on file  Social History Narrative  . Not on file   Social Determinants of Health   Financial Resource Strain: Not on file  Food Insecurity: Not on file  Transportation Needs: Not on file  Physical Activity: Not on file  Stress: Not on file  Social Connections: Not on file  Intimate Partner Violence: Not on file    Outpatient Medications Prior to Visit  Medication Sig Dispense Refill  . albuterol (VENTOLIN HFA) 108 (90 Base) MCG/ACT inhaler Inhale 2 puffs into the lungs every 6 (six) hours as needed. 18 g 0  . atorvastatin (LIPITOR) 10 MG tablet Take 1 tablet (10 mg total) by mouth daily. 90 tablet 3  . azelastine (ASTELIN) 0.1 % nasal spray Place 2 sprays into both nostrils 2 (two) times daily. Use in each nostril as directed 30 mL 12  . azithromycin (ZITHROMAX) 250 MG tablet Take 2 tablets by mouth on day 1, followed by 1 tablet by mouth daily for 4 days. 6 tablet 0  . fluconazole (DIFLUCAN) 150 MG tablet 1 po x1, may repeat in 3 days prn (Patient not taking: No sig reported) 2 tablet 0  . fluticasone (FLONASE) 50 MCG/ACT nasal spray Use 2 spray(s) in each  nostril once daily 16 g 0  . losartan (COZAAR) 100 MG tablet Take 1 tablet by mouth once daily 90 tablet 0  . megestrol (MEGACE) 40 MG tablet Take 1 tablet (40 mg total) by mouth 2 (two) times daily. Can increase to two tablets twice a day in the event of heavy bleeding 60 tablet 2  . metFORMIN (GLUCOPHAGE) 1000 MG tablet Take 1 tablet (1,000 mg total) by mouth 2 (two) times daily with a meal. 180 tablet 3  . metoprolol succinate (TOPROL-XL) 50 MG 24 hr tablet Take 1 tablet (50 mg total) by mouth daily. Take with or immediately following a meal. 30 tablet 0  . montelukast (SINGULAIR) 10 MG tablet Take 1 tablet (10 mg total) by mouth at bedtime. (Patient taking differently: Take 10 mg by mouth daily as  needed. Allergies) 90 tablet 3  . Multiple Vitamin (MULTIVITAMIN WITH MINERALS) TABS tablet Take 1 tablet by mouth daily.     No facility-administered medications prior to visit.    No Known Allergies  Review of Systems  Gastrointestinal: Positive for abdominal pain and nausea. Negative for vomiting.  Genitourinary: Negative for dysuria.  Neurological: Positive for headaches.       Objective:    Physical Exam Constitutional:      Appearance: Normal appearance.  HENT:     Head: Normocephalic and atraumatic.     Right Ear: External ear normal.     Left Ear: External ear normal.  Eyes:     Extraocular Movements: Extraocular movements intact.     Pupils: Pupils are equal, round, and reactive to light.  Cardiovascular:     Rate and Rhythm: Normal rate and regular rhythm.     Pulses: Normal pulses.     Heart sounds: Normal heart sounds. No murmur heard. No friction rub. No gallop.   Pulmonary:     Effort: Pulmonary effort is normal. No respiratory distress.     Breath sounds: Normal breath sounds. No stridor. No wheezing, rhonchi or rales.  Abdominal:     General: Bowel sounds are normal. There is no distension.     Palpations: Abdomen is soft.     Tenderness: There is no abdominal tenderness.  Skin:    General: Skin is warm and dry.  Neurological:     Mental Status: She is alert and oriented to person, place, and time.  Psychiatric:        Behavior: Behavior normal.     There were no vitals taken for this visit. Wt Readings from Last 3 Encounters:  08/26/20 202 lb 3.2 oz (91.7 kg)  05/25/20 212 lb (96.2 kg)  03/31/20 208 lb (94.3 kg)    Diabetic Foot Exam - Simple   No data filed    Lab Results  Component Value Date   WBC 9.1 03/08/2020   HGB 8.7 (L) 03/08/2020   HCT 29.1 (L) 03/08/2020   PLT 597 (H) 03/08/2020   GLUCOSE 221 (H) 08/26/2020   CHOL 118 08/26/2020   TRIG 75.0 08/26/2020   HDL 49.30 08/26/2020   LDLCALC 54 08/26/2020   ALT 43 (H)  08/26/2020   AST 42 (H) 08/26/2020   NA 135 08/26/2020   K 4.5 08/26/2020   CL 103 08/26/2020   CREATININE 0.77 08/26/2020   BUN 10 08/26/2020   CO2 24 08/26/2020   HGBA1C 9.1 (H) 08/26/2020    No results found for: TSH Lab Results  Component Value Date   WBC 9.1 03/08/2020   HGB 8.7 (L)  03/08/2020   HCT 29.1 (L) 03/08/2020   MCV 61 (L) 03/08/2020   PLT 597 (H) 03/08/2020   Lab Results  Component Value Date   NA 135 08/26/2020   K 4.5 08/26/2020   CO2 24 08/26/2020   GLUCOSE 221 (H) 08/26/2020   BUN 10 08/26/2020   CREATININE 0.77 08/26/2020   BILITOT 0.6 08/26/2020   ALKPHOS 94 08/26/2020   AST 42 (H) 08/26/2020   ALT 43 (H) 08/26/2020   PROT 7.5 08/26/2020   ALBUMIN 4.2 08/26/2020   CALCIUM 10.2 08/26/2020   ANIONGAP 10 03/04/2020   GFR 94.90 08/26/2020   Lab Results  Component Value Date   CHOL 118 08/26/2020   Lab Results  Component Value Date   HDL 49.30 08/26/2020   Lab Results  Component Value Date   LDLCALC 54 08/26/2020   Lab Results  Component Value Date   TRIG 75.0 08/26/2020   Lab Results  Component Value Date   CHOLHDL 2 08/26/2020   Lab Results  Component Value Date   HGBA1C 9.1 (H) 08/26/2020       Assessment & Plan:   Problem List Items Addressed This Visit   None      No orders of the defined types were placed in this encounter.   I, [name], personally preformed the services described in this documentation.  All medical record entries made by the scribe were at my direction and in my presence.  I have reviewed the chart and discharge instructions (if applicable) and agree that the record reflects my personal performance and is accurate and complete. 01/04/2021   I,Nicole Ryan,acting as a Education administrator for Nance Pear, NP.,have documented all relevant documentation on the behalf of Nance Pear, NP,as directed by  Nance Pear, NP while in the presence of Nance Pear,  NP.   Nicole [employer]

## 2021-01-05 LAB — PREGNANCY, URINE: Preg Test, Ur: NEGATIVE

## 2021-01-06 ENCOUNTER — Ambulatory Visit: Payer: BC Managed Care – PPO | Admitting: Family

## 2021-01-23 ENCOUNTER — Encounter: Payer: Self-pay | Admitting: Medical

## 2021-01-23 ENCOUNTER — Ambulatory Visit (INDEPENDENT_AMBULATORY_CARE_PROVIDER_SITE_OTHER): Payer: PRIVATE HEALTH INSURANCE | Admitting: Medical

## 2021-01-23 ENCOUNTER — Other Ambulatory Visit: Payer: Self-pay

## 2021-01-23 VITALS — BP 160/90 | HR 80 | Ht 67.0 in | Wt 214.0 lb

## 2021-01-23 DIAGNOSIS — E119 Type 2 diabetes mellitus without complications: Secondary | ICD-10-CM | POA: Diagnosis not present

## 2021-01-23 DIAGNOSIS — D229 Melanocytic nevi, unspecified: Secondary | ICD-10-CM | POA: Diagnosis not present

## 2021-01-23 DIAGNOSIS — L989 Disorder of the skin and subcutaneous tissue, unspecified: Secondary | ICD-10-CM

## 2021-01-23 DIAGNOSIS — I10 Essential (primary) hypertension: Secondary | ICD-10-CM

## 2021-01-23 LAB — LIPID PANEL
Cholesterol: 129 mg/dL (ref 0–200)
HDL: 57.4 mg/dL (ref >39.00)
LDL Cholesterol: 56 mg/dL (ref 0–99)
NonHDL: 71.48
Total CHOL/HDL Ratio: 2
Triglycerides: 75 mg/dL (ref 0.0–149.0)
VLDL: 15 mg/dL (ref 0.0–40.0)

## 2021-01-23 LAB — COMPREHENSIVE METABOLIC PANEL
ALT: 24 U/L (ref 0–35)
AST: 28 U/L (ref 0–37)
Albumin: 3.6 g/dL (ref 3.5–5.2)
Alkaline Phosphatase: 101 U/L (ref 39–117)
BUN: 11 mg/dL (ref 6–23)
CO2: 25 mEq/L (ref 19–32)
Calcium: 9.3 mg/dL (ref 8.4–10.5)
Chloride: 105 mEq/L (ref 96–112)
Creatinine, Ser: 0.77 mg/dL (ref 0.40–1.20)
GFR: 94.63 mL/min (ref >60.00)
Glucose, Bld: 198 mg/dL — ABNORMAL HIGH (ref 70–99)
Potassium: 4.3 mEq/L (ref 3.5–5.1)
Sodium: 138 mEq/L (ref 135–145)
Total Bilirubin: 0.4 mg/dL (ref 0.2–1.2)
Total Protein: 6.5 g/dL (ref 6.0–8.3)

## 2021-01-23 LAB — HEMOGLOBIN A1C: Hgb A1c MFr Bld: 9.3 % — ABNORMAL HIGH (ref 4.6–6.5)

## 2021-01-23 MED ORDER — AMLODIPINE BESYLATE 5 MG PO TABS: 5.0000 mg | ORAL_TABLET | Freq: Every day | ORAL | 3 refills | Status: DC

## 2021-01-23 NOTE — Patient Instructions (Addendum)
Your blood pressure is high today. Normal neurologic with no motor or sensory deficits but you report mild ha. Will rx amlodipine 5 mg daily. Continue your metroprol xl and losartan. If you have worse ha or gross motor/sensory function deficits then ED evaluation. Not to take any nsaids presently as this can increase bp.  For diabetes get cmp, lipid panel and a1c. Continue metformin and eat low sugar diet.  For skin lesions/mole placed referral to dermatologist.   Follow up in one week or as neede

## 2021-01-23 NOTE — Progress Notes (Signed)
Subjective:    Patient ID: Nicole Ryan, female    DOB: [birth year], 45 y.o.   MRN: [id number]  HPI  Pt in for htn follow up. Pt is on losartan 100 mg daily and toprol xl 50 mg daily.   Pt is diabetic. 5 months ago a1c was 9.1. Pt is on metformin 1000 mg twice daily.  Pt admits not eating low sugar diet. 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   On atorvastatin due to diabetes/risk factor concern.  Pt has group of moles on her left side flank and on her left thigh. Pt states her left area had lesion that she scratches will bleed and will come back. Will come back for years.  lmp- started wed last week. Pt not married and not sexually active.     Review of Systems  Constitutional: Negative for chills.  Respiratory: Negative for cough, chest tightness, shortness of breath and wheezing.   Cardiovascular: Negative for chest pain and palpitations.  Gastrointestinal: Negative for abdominal pain.  Musculoskeletal: Negative for back pain.  Skin:       Skin lesion and moles. See hpi.  Neurological: Negative for dizziness, weakness, numbness and headaches.  Hematological: Negative for adenopathy. Does not bruise/bleed easily.  Psychiatric/Behavioral: Negative for behavioral problems, confusion and suicidal ideas. The patient is not nervous/anxious.     Past Medical History:  Diagnosis Date  . Allergy   . GERD (gastroesophageal reflux disease)    heart burn. 2-3 times a week.  . Hypertension      Social History   Socioeconomic History  . Marital status: Single    Spouse name: Not on file  . Number of children: Not on file  . Years of education: Not on file  . Highest education level: Not on file  Occupational History  . Occupation: Control and instrumentation engineer  Tobacco Use  . Smoking status: Never Smoker  . Smokeless tobacco: Never Used  Vaping Use  . Vaping Use: Never used  Substance and Sexual Activity  . Alcohol use: Not Currently  . Drug  use: Never  . Sexual activity: Not Currently    Birth control/protection: None  Other Topics Concern  . Not on file  Social History Narrative  . Not on file   Social Determinants of Health   Financial Resource Strain: Not on file  Food Insecurity: Not on file  Transportation Needs: Not on file  Physical Activity: Not on file  Stress: Not on file  Social Connections: Not on file  Intimate Partner Violence: Not on file    Past Surgical History:  Procedure Laterality Date  . BREAST BIOPSY Bilateral   . breast surg Bilateral    Beign tumors both breast    Family History  Problem Relation Age of Onset  . Aneurysm Mother   . Hypertension Mother   . Diabetes Mother     No Known Allergies  Current Outpatient Medications on File Prior to Visit  Medication Sig Dispense Refill  . albuterol (VENTOLIN HFA) 108 (90 Base) MCG/ACT inhaler Inhale 2 puffs into the lungs every 6 (six) hours as needed. 18 g 0  . atorvastatin (LIPITOR) 10 MG tablet Take 1 tablet (10 mg total) by mouth daily. 90 tablet 0  . azelastine (ASTELIN) 0.1 % nasal spray Place 2 sprays into both nostrils 2 (two) times daily. Use in each nostril as directed 30 mL 12  . fluconazole (DIFLUCAN) 150 MG tablet 1 po x1, may repeat  in 3 days prn 2 tablet 0  . fluticasone (FLONASE) 50 MCG/ACT nasal spray Use 2 spray(s) in each nostril once daily 16 g 0  . losartan (COZAAR) 100 MG tablet Take 1 tablet (100 mg total) by mouth daily. 90 tablet 0  . metFORMIN (GLUCOPHAGE) 1000 MG tablet Take 1 tablet (1,000 mg total) by mouth 2 (two) times daily with a meal. 180 tablet 3  . metoprolol succinate (TOPROL-XL) 50 MG 24 hr tablet Take 1 tablet (50 mg total) by mouth daily. Take with or immediately following a meal. 90 tablet 0  . montelukast (SINGULAIR) 10 MG tablet Take 1 tablet (10 mg total) by mouth at bedtime. (Patient taking differently: Take 10 mg by mouth daily as needed. Allergies) 90 tablet 3  . Multiple Vitamin (MULTIVITAMIN  WITH MINERALS) TABS tablet Take 1 tablet by mouth daily.    . ondansetron (ZOFRAN) 4 MG tablet Take 1 tablet (4 mg total) by mouth every 8 (eight) hours as needed for nausea or vomiting. 20 tablet 0   No current facility-administered medications on file prior to visit.    BP (!) 160/100   Pulse 80   Ht 5\' 7"  (1.702 m)   Wt 214 lb (97.1 kg)   SpO2 100%   BMI 33.52 kg/m       Objective:   Physical Exam  General Mental Status- Alert. General Appearance- Not in acute distress.   Skin Approximate 25 small moles left side rib area. One small mole left lateral thigh.  Neck Carotid Arteries- Normal color. Moisture- Normal Moisture. No carotid bruits. No JVD.  Chest and Lung Exam Auscultation: Breath Sounds:-Normal.  Cardiovascular Auscultation:Rythm- Regular. Murmurs & Other Heart Sounds:Auscultation of the heart reveals- No Murmurs.  Abdomen Inspection:-Inspeection Normal. Palpation/Percussion:Note:No mass. Palpation and Percussion of the abdomen reveal- Non Tender, Non Distended + BS, no rebound or guarding.   Neurologic  Cranial Nerve exam:- CN III-XII intact(No nystagmus), symmetric smile. Strength:- 5/5 equal and symmetric strength both upper and lower extremities.       Assessment & Plan:  Your blood pressure is high today. Normal neurologic with no motor or sensory deficits but you report mild ha. Will rx amlodipine 5 mg daily. Continue your metroprol xl and losartan. If you have worse ha or gross motor/sensory function deficits then ED evaluation. Not to take any nsaids presently as this can increase bp.  For diabetes get cmp, lipid panel and a1c. Continue metformin and eat low sugar diet.  For skin lesions/mole placed referral to dermatologist.   Follow up in one week or as needed   [employer], PA-C

## 2021-01-24 ENCOUNTER — Telehealth: Payer: Self-pay | Admitting: Medical

## 2021-01-24 ENCOUNTER — Telehealth: Payer: Self-pay

## 2021-01-24 MED ORDER — SAXAGLIPTIN HCL 5 MG PO TABS: 5.0000 mg | ORAL_TABLET | Freq: Every day | ORAL | 3 refills | Status: DC

## 2021-01-24 MED ORDER — SITAGLIPTIN PHOSPHATE 50 MG PO TABS: 50.0000 mg | ORAL_TABLET | Freq: Every day | ORAL | 3 refills | Status: DC

## 2021-01-24 NOTE — Telephone Encounter (Signed)
Rx [place] sent to pharmacy.

## 2021-01-24 NOTE — Telephone Encounter (Signed)
Onglyza not covered by insurance    Alternatives:   Metformin HCL Metformin HCL ER Farixga Januvia Jardiance Trijardy Trulicity

## 2021-01-24 NOTE — Telephone Encounter (Signed)
Rx onglyza sent to pharmacy.

## 2021-02-07 ENCOUNTER — Telehealth: Payer: Self-pay | Admitting: Medical

## 2021-02-07 ENCOUNTER — Encounter: Payer: Self-pay | Admitting: Medical

## 2021-02-07 MED ORDER — CANAGLIFLOZIN 300 MG PO TABS: 300.0000 mg | ORAL_TABLET | Freq: Every day | ORAL | 3 refills | Status: DC

## 2021-02-07 NOTE — Telephone Encounter (Signed)
Januvia cancelled.

## 2021-02-08 NOTE — Telephone Encounter (Signed)
Rx invokana and cancelled [place].

## 2021-02-09 ENCOUNTER — Telehealth: Payer: Self-pay

## 2021-02-09 ENCOUNTER — Telehealth: Payer: Self-pay | Admitting: Medical

## 2021-02-09 MED ORDER — SITAGLIPTIN PHOSPHATE 50 MG PO TABS: 50.0000 mg | ORAL_TABLET | Freq: Every day | ORAL | 3 refills | Status: DC

## 2021-02-09 NOTE — Telephone Encounter (Signed)
Invokana not covered , pharmacy sent over meds that are covered   Lantus Solostar Metformin HCl Farxiga Januvia Jardiance Trijardy XR

## 2021-02-09 NOTE — Telephone Encounter (Signed)
Januvia prescription sent to pt pharmacy.

## 2021-02-16 NOTE — Telephone Encounter (Signed)
Pharm called & said this medication isnt covered  Insurance covers  [place] or jardiance

## 2021-05-10 ENCOUNTER — Encounter: Payer: PRIVATE HEALTH INSURANCE | Admitting: Medical

## 2021-05-16 ENCOUNTER — Ambulatory Visit (INDEPENDENT_AMBULATORY_CARE_PROVIDER_SITE_OTHER): Payer: PRIVATE HEALTH INSURANCE | Admitting: Medical

## 2021-05-16 ENCOUNTER — Other Ambulatory Visit: Payer: Self-pay

## 2021-05-16 VITALS — BP 140/80 | HR 85 | Resp 18 | Ht 67.0 in | Wt 209.6 lb

## 2021-05-16 DIAGNOSIS — Z23 Encounter for immunization: Secondary | ICD-10-CM

## 2021-05-16 DIAGNOSIS — I10 Essential (primary) hypertension: Secondary | ICD-10-CM | POA: Diagnosis not present

## 2021-05-16 DIAGNOSIS — Z0000 Encounter for general adult medical examination without abnormal findings: Secondary | ICD-10-CM | POA: Diagnosis not present

## 2021-05-16 DIAGNOSIS — Z113 Encounter for screening for infections with a predominantly sexual mode of transmission: Secondary | ICD-10-CM

## 2021-05-16 DIAGNOSIS — E119 Type 2 diabetes mellitus without complications: Secondary | ICD-10-CM

## 2021-05-16 DIAGNOSIS — Z111 Encounter for screening for respiratory tuberculosis: Secondary | ICD-10-CM

## 2021-05-16 DIAGNOSIS — D649 Anemia, unspecified: Secondary | ICD-10-CM

## 2021-05-16 NOTE — Patient Instructions (Addendum)
For you wellness exam today I have ordered cbc, cmp,lipid panel and hiv.  Vaccine today tdap. Flu and covid vaccine postpone to mid October.  Recommend exercise and healthy diet.  We will let you know lab results as they come in.  Follow up date appointment will be determined after lab review.    Will add tb bold test for screening  per [facility] form.   Htn borderline controlled on recheck. Continue 3 bp med regimen. Check bp daily over next week and send me my chart update. Want to confirm staying.  For diabetes will get a1c. Continue metformin. Follow a1c and then investigate the rx I sent in this summer. 3 meds sent in. You don't remember fillng any. One cost $500 per month. Meds were onglyza, [place] and invokana.  Preventive Care 50-11 Years Old, Female Preventive care refers to lifestyle choices and visits with your health care provider that can promote health and wellness. This includes: A yearly physical exam. This is also called an annual wellness visit. Regular dental and eye exams. Immunizations. Screening for certain conditions. Healthy lifestyle choices, such as: Eating a healthy diet. Getting regular exercise. Not using drugs or products that contain nicotine and tobacco. Limiting alcohol use. What can I expect for my preventive care visit? Physical exam Your health care provider will check your: Height and weight. These may be used to calculate your BMI (body mass index). BMI is a measurement that tells if you are at a healthy weight. Heart rate and blood pressure. Body temperature. Skin for abnormal spots. Counseling Your health care provider may ask you questions about your: Past medical problems. Family's medical history. Alcohol, tobacco, and drug use. Emotional well-being. Home life and relationship well-being. Sexual activity. Diet, exercise, and sleep habits. Work and work Statistician. Access to firearms. Method of birth control. Menstrual  cycle. Pregnancy history. What immunizations do I need? Vaccines are usually given at various ages, according to a schedule. Your health care provider will recommend vaccines for you based on your age, medical history, and lifestyle or other factors, such as travel or where you work. What tests do I need? Blood tests Lipid and cholesterol levels. These may be checked every 5 years, or more often if you are over 22 years old. Hepatitis C test. Hepatitis B test. Screening Lung cancer screening. You may have this screening every year starting at age 4 if you have a 30-pack-year history of smoking and currently smoke or have quit within the past 15 years. Colorectal cancer screening. All adults should have this screening starting at age 41 and continuing until age 20. Your health care provider may recommend screening at age 69 if you are at increased risk. You will have tests every 1-10 years, depending on your results and the type of screening test. Diabetes screening. This is done by checking your blood sugar (glucose) after you have not eaten for a while (fasting). You may have this done every 1-3 years. Mammogram. This may be done every 1-2 years. Talk with your health care provider about when you should start having regular mammograms. This may depend on whether you have a family history of breast cancer. BRCA-related cancer screening. This may be done if you have a family history of breast, ovarian, tubal, or peritoneal cancers. Pelvic exam and Pap test. This may be done every 3 years starting at age 57. Starting at age 91, this may be done every 5 years if you have a Pap test in combination  with an HPV test. Other tests STD (sexually transmitted disease) testing, if you are at risk. Bone density scan. This is done to screen for osteoporosis. You may have this scan if you are at high risk for osteoporosis. Talk with your health care provider about your test results, treatment options,  and if necessary, the need for more tests. Follow these instructions at home: Eating and drinking  Eat a diet that includes fresh fruits and vegetables, whole grains, lean protein, and low-fat dairy products. Take vitamin and mineral supplements as recommended by your health care provider. Do not drink alcohol if: Your health care provider tells you not to drink. You are pregnant, may be pregnant, or are planning to become pregnant. If you drink alcohol: Limit how much you have to 0-1 drink a day. Be aware of how much alcohol is in your drink. In the [place] one drink equals one 12 oz bottle of beer (355 mL), one 5 oz glass of wine (148 mL), or one 1 oz glass of hard liquor (44 mL). Lifestyle Take daily care of your teeth and gums. Brush your teeth every morning and night with fluoride toothpaste. Floss one time each day. Stay active. Exercise for at least 30 minutes 5 or more days each week. Do not use any products that contain nicotine or tobacco, such as cigarettes, e-cigarettes, and chewing tobacco. If you need help quitting, ask your health care provider. Do not use drugs. If you are sexually active, practice safe sex. Use a condom or other form of protection to prevent STIs (sexually transmitted infections). If you do not wish to become pregnant, use a form of birth control. If you plan to become pregnant, see your health care provider for a prepregnancy visit. If told by your health care provider, take low-dose aspirin daily starting at age 70. Find healthy ways to cope with stress, such as: Meditation, yoga, or listening to music. Journaling. Talking to a trusted person. Spending time with friends and family. Safety Always wear your seat belt while driving or riding in a vehicle. Do not drive: If you have been drinking alcohol. Do not ride with someone who has been drinking. When you are tired or distracted. While texting. Wear a helmet and other protective equipment during  sports activities. If you have firearms in your house, make sure you follow all gun safety procedures. What's next? Visit your health care provider once a year for an annual wellness visit. Ask your health care provider how often you should have your eyes and teeth checked. Stay up to date on all vaccines. This information is not intended to replace advice given to you by your health care provider. Make sure you discuss any questions you have with your health care provider. Document Revised: 10/14/2020 Document Reviewed: 04/17/2018 Elsevier Patient Education  2022 [employer].

## 2021-05-16 NOTE — Progress Notes (Signed)
Subjective:    Patient ID: Nicole Ryan, female    DOB: 10/02/1975, 45 y.o.   MRN: [id number]  HPI  Pt in for cpe/wellness exam. Has school form to fill out for employement.  Pt has new with [place]. Pt not exercising. Pt diet has been moderate healthy. Cutting back on sugar. None smoker. 2 glasses of wine twice a month at most. Rare caffeine beverage.  Pt has not had flu vaccine. She postpones today.  Pt will get tdap today.  Pt states she get repeat tb screening at health dept. On review pt had no bcg vaccine. Pt has no cough, no fever, no night sweats, no sob, no wt loss or fatigue.    Pt is diabetic. Pt is only on metformin. I had written medication januvia and invokana. I think onglyza was very expensive so tried other meds.  Hx of htn. Pt on amlodipine losatan and toprol.   Review of Systems  Constitutional:  Negative for chills.  Respiratory:  Negative for cough, chest tightness, shortness of breath and wheezing.   Cardiovascular:  Negative for chest pain and palpitations.  Gastrointestinal:  Negative for abdominal pain.  Musculoskeletal:  Negative for back pain.  Neurological:  Negative for dizziness, weakness, numbness and headaches.  Hematological:  Negative for adenopathy. Does not bruise/bleed easily.  Psychiatric/Behavioral:  Negative for behavioral problems, confusion and suicidal ideas. The patient is not nervous/anxious.   Past Medical History:  Diagnosis Date   Allergy    GERD (gastroesophageal reflux disease)    heart burn. 2-3 times a week.   Hypertension      Social History   Socioeconomic History   Marital status: Single    Spouse name: Not on file   Number of children: Not on file   Years of education: Not on file   Highest education level: Not on file  Occupational History   Occupation: Control and instrumentation engineer  Tobacco Use   Smoking status: Never   Smokeless tobacco: Never  Vaping Use   Vaping Use: Never used  Substance and Sexual  Activity   Alcohol use: Not Currently   Drug use: Never   Sexual activity: Not Currently    Birth control/protection: None  Other Topics Concern   Not on file  Social History Narrative   Not on file   Social Determinants of Health   Financial Resource Strain: Not on file  Food Insecurity: Not on file  Transportation Needs: Not on file  Physical Activity: Not on file  Stress: Not on file  Social Connections: Not on file  Intimate Partner Violence: Not on file    Past Surgical History:  Procedure Laterality Date   BREAST BIOPSY Bilateral    breast surg Bilateral    Beign tumors both breast    Family History  Problem Relation Age of Onset   Aneurysm Mother    Hypertension Mother    Diabetes Mother     No Known Allergies  Current Outpatient Medications on File Prior to Visit  Medication Sig Dispense Refill   albuterol (VENTOLIN HFA) 108 (90 Base) MCG/ACT inhaler Inhale 2 puffs into the lungs every 6 (six) hours as needed. 18 g 0   amLODipine (NORVASC) 5 MG tablet Take 1 tablet (5 mg total) by mouth daily. 30 tablet 3   atorvastatin (LIPITOR) 10 MG tablet Take 1 tablet (10 mg total) by mouth daily. 90 tablet 0   azelastine (ASTELIN) 0.1 % nasal spray Place 2 sprays into both nostrils 2 (  two) times daily. Use in each nostril as directed 30 mL 12   canagliflozin (INVOKANA) 300 MG TABS tablet Take 1 tablet (300 mg total) by mouth daily before breakfast. 30 tablet 3   fluconazole (DIFLUCAN) 150 MG tablet 1 po x1, may repeat in 3 days prn 2 tablet 0   fluticasone (FLONASE) 50 MCG/ACT nasal spray Use 2 spray(s) in each nostril once daily 16 g 0   losartan (COZAAR) 100 MG tablet Take 1 tablet (100 mg total) by mouth daily. 90 tablet 0   metFORMIN (GLUCOPHAGE) 1000 MG tablet Take 1 tablet (1,000 mg total) by mouth 2 (two) times daily with a meal. 180 tablet 3   metoprolol succinate (TOPROL-XL) 50 MG 24 hr tablet Take 1 tablet (50 mg total) by mouth daily. Take with or immediately  following a meal. 90 tablet 0   montelukast (SINGULAIR) 10 MG tablet Take 1 tablet (10 mg total) by mouth at bedtime. (Patient taking differently: Take 10 mg by mouth daily as needed. Allergies) 90 tablet 3   Multiple Vitamin (MULTIVITAMIN WITH MINERALS) TABS tablet Take 1 tablet by mouth daily.     ondansetron (ZOFRAN) 4 MG tablet Take 1 tablet (4 mg total) by mouth every 8 (eight) hours as needed for nausea or vomiting. 20 tablet 0   sitaGLIPtin (JANUVIA) 50 MG tablet Take 1 tablet (50 mg total) by mouth daily. 30 tablet 3   No current facility-administered medications on file prior to visit.    BP (!) 160/70   Pulse 85   Resp 18   Ht 5\' 7"  (1.702 m)   Wt 209 lb 9.6 oz (95.1 kg)   SpO2 100%   BMI 32.83 kg/m        Objective:   Physical Exam  General- No acute distress. Pleasant patient. Neck- Full range of motion, no jvd Lungs- Clear, even and unlabored. Heart- regular rate and rhythm. Neurologic- CNII- XII grossly intact.      Assessment & Plan:  For you wellness exam today I have ordered cbc, cmp,lipid panel and hiv.  Vaccine today tdap. Flu and covid vaccine postpone to mid October.  Recommend exercise and healthy diet.  We will let you know lab results as they come in.  Follow up date appointment will be determined after lab review.    Will add tb bold test for screening  per [place] [facility] form.   Htn borderline controlled on recheck. Continue 3 bp med regimen. Check bp daily over next week and send me my chart update. Want to confirm staying.  For diabetes will get a1c. Continue metformin. Follow a1c and then investigate the rx I sent in this summer. 3 meds sent in. You don't remember fillng any. One cost $500 per month. Meds were onglyza, [place] and invokana.   [name], [place]   [id number] as did also address diabetes and htn in addition to wellness.  State form to be completed when tb blood test back.

## 2021-05-17 LAB — COMPREHENSIVE METABOLIC PANEL
ALT: 30 U/L (ref 0–35)
AST: 40 U/L — ABNORMAL HIGH (ref 0–37)
Albumin: 4.2 g/dL (ref 3.5–5.2)
Alkaline Phosphatase: 86 U/L (ref 39–117)
BUN: 12 mg/dL (ref 6–23)
CO2: 23 mEq/L (ref 19–32)
Calcium: 10 mg/dL (ref 8.4–10.5)
Chloride: 104 mEq/L (ref 96–112)
Creatinine, Ser: 0.73 mg/dL (ref 0.40–1.20)
GFR: 100.66 mL/min (ref >60.00)
Glucose, Bld: 74 mg/dL (ref 70–99)
Potassium: 4 mEq/L (ref 3.5–5.1)
Sodium: 137 mEq/L (ref 135–145)
Total Bilirubin: 0.4 mg/dL (ref 0.2–1.2)
Total Protein: 7.5 g/dL (ref 6.0–8.3)

## 2021-05-17 LAB — CBC WITH DIFFERENTIAL/PLATELET
Basophils Absolute: 0 10*3/uL (ref 0.0–0.1)
Basophils Relative: 0.6 % (ref 0.0–3.0)
Eosinophils Absolute: 0.2 10*3/uL (ref 0.0–0.7)
Eosinophils Relative: 2.2 % (ref 0.0–5.0)
HCT: 33.5 % — ABNORMAL LOW (ref 36.0–46.0)
Hemoglobin: 10.5 g/dL — ABNORMAL LOW (ref 12.0–15.0)
Lymphocytes Relative: 38.3 % (ref 12.0–46.0)
Lymphs Abs: 3.2 10*3/uL (ref 0.7–4.0)
MCHC: 31.2 g/dL (ref 30.0–36.0)
MCV: 70.1 fl — ABNORMAL LOW (ref 78.0–100.0)
Monocytes Absolute: 0.7 10*3/uL (ref 0.1–1.0)
Monocytes Relative: 8.8 % (ref 3.0–12.0)
Neutro Abs: 4.2 10*3/uL (ref 1.4–7.7)
Neutrophils Relative %: 50.1 % (ref 43.0–77.0)
Platelets: 505 10*3/uL — ABNORMAL HIGH (ref 150.0–400.0)
RBC: 4.77 Mil/uL (ref 3.87–5.11)
RDW: 15.6 % — ABNORMAL HIGH (ref 11.5–15.5)
WBC: 8.4 10*3/uL (ref 4.0–10.5)

## 2021-05-17 LAB — LIPID PANEL
Cholesterol: 155 mg/dL (ref 0–200)
HDL: 77.6 mg/dL (ref >39.00)
LDL Cholesterol: 54 mg/dL (ref 0–99)
NonHDL: 77.8
Total CHOL/HDL Ratio: 2
Triglycerides: 120 mg/dL (ref 0.0–149.0)
VLDL: 24 mg/dL (ref 0.0–40.0)

## 2021-05-17 NOTE — Addendum Note (Signed)
Addended by: Anabel Halon on: 05/17/2021 10:26 PM   Modules accepted: Orders

## 2021-05-18 ENCOUNTER — Other Ambulatory Visit (INDEPENDENT_AMBULATORY_CARE_PROVIDER_SITE_OTHER): Payer: PRIVATE HEALTH INSURANCE

## 2021-05-18 DIAGNOSIS — E1369 Other specified diabetes mellitus with other specified complication: Secondary | ICD-10-CM | POA: Diagnosis not present

## 2021-05-18 LAB — HEMOGLOBIN A1C: Hgb A1c MFr Bld: 7.4 % — ABNORMAL HIGH (ref 4.6–6.5)

## 2021-05-18 NOTE — Addendum Note (Signed)
Addended by: Octavio Manns E on: 05/18/2021 01:46 PM   Modules accepted: Orders

## 2021-05-20 LAB — QUANTIFERON-TB GOLD PLUS
Mitogen-NIL: >10 IU/mL
NIL: 0.05 IU/mL
QuantiFERON-TB Gold Plus: NEGATIVE
TB1-NIL: <0 IU/mL
TB2-NIL: <0 IU/mL

## 2021-07-03 ENCOUNTER — Other Ambulatory Visit: Payer: Self-pay | Admitting: Family

## 2021-07-03 NOTE — Telephone Encounter (Signed)
Rx atrovasttin sent to pt pharamcy

## 2021-07-17 ENCOUNTER — Other Ambulatory Visit: Payer: Self-pay | Admitting: Medical

## 2021-08-27 ENCOUNTER — Other Ambulatory Visit: Payer: Self-pay | Admitting: Medical

## 2021-08-28 ENCOUNTER — Other Ambulatory Visit: Payer: Self-pay | Admitting: Medical

## 2021-10-30 ENCOUNTER — Other Ambulatory Visit: Payer: Self-pay | Admitting: Medical

## 2021-12-04 ENCOUNTER — Other Ambulatory Visit: Payer: Self-pay | Admitting: Medical

## 2021-12-12 ENCOUNTER — Other Ambulatory Visit: Payer: Self-pay | Admitting: Medical

## 2021-12-21 ENCOUNTER — Other Ambulatory Visit: Payer: Self-pay | Admitting: Medical

## 2021-12-22 ENCOUNTER — Other Ambulatory Visit: Payer: Self-pay | Admitting: Medical

## 2022-01-12 IMAGING — US US OB TRANSVAGINAL
1 series · 13 of 28 positions shown · non-contrast
Comparison: None.

CLINICAL DATA: 44-year-old pregnant female with vaginal bleeding.
LMP: 01/18/2020 corresponding to an estimated gestational age of 6
weeks, 4 days.

EXAM:
OBSTETRIC <14 WK US AND TRANSVAGINAL OB US
TECHNIQUE: Both transabdominal and transvaginal ultrasound examinations were
performed for complete evaluation of the gestation as well as the
maternal uterus, adnexal regions, and pelvic cul-de-sac.
Transvaginal technique was performed to assess early pregnancy.

[Series 1: us ob transvaginal · 112 acquisitions, 13 frames shown]
[im 5/112]
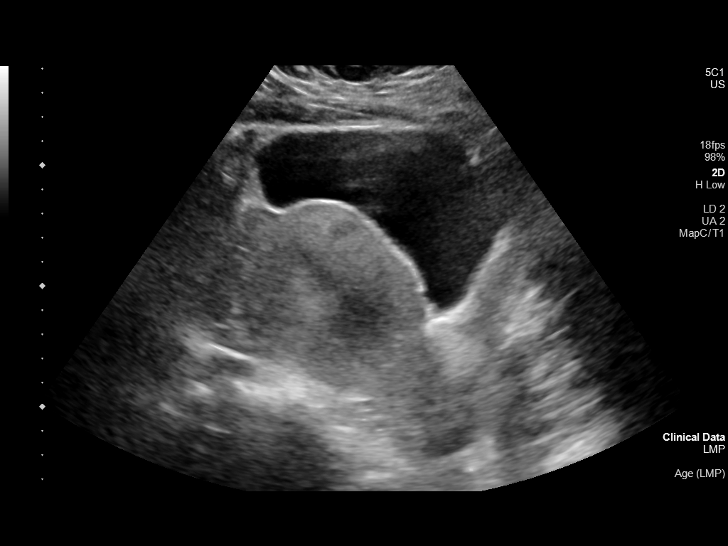
[im 13/112]
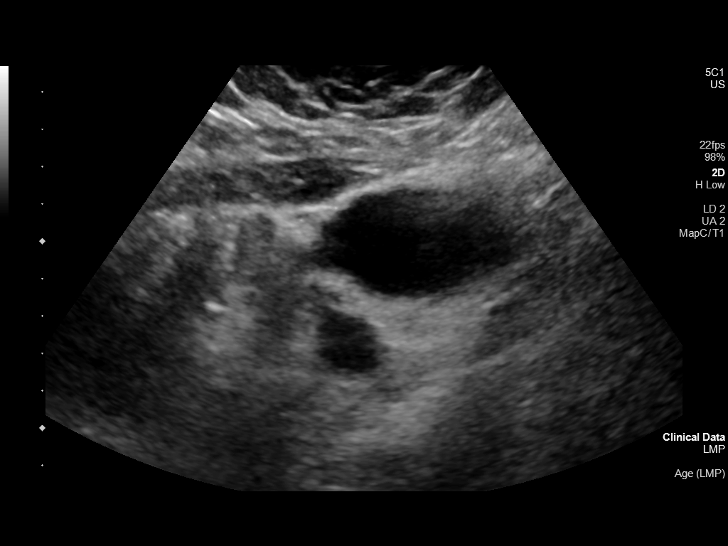
[im 21/112]
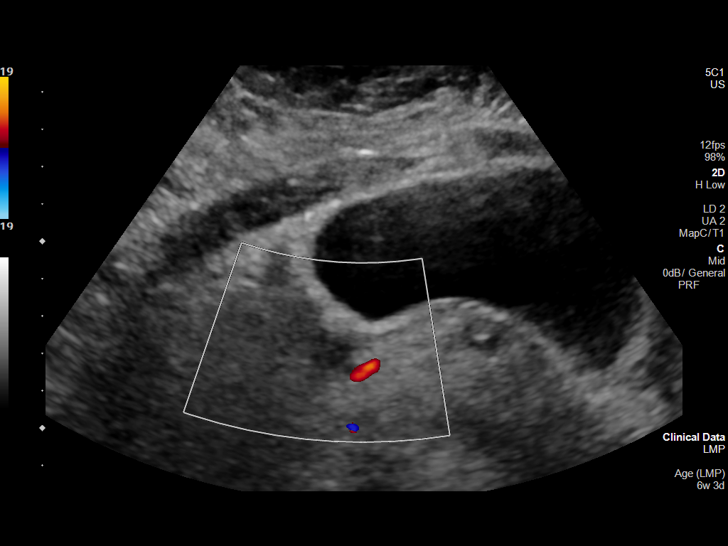
[im 29/112]
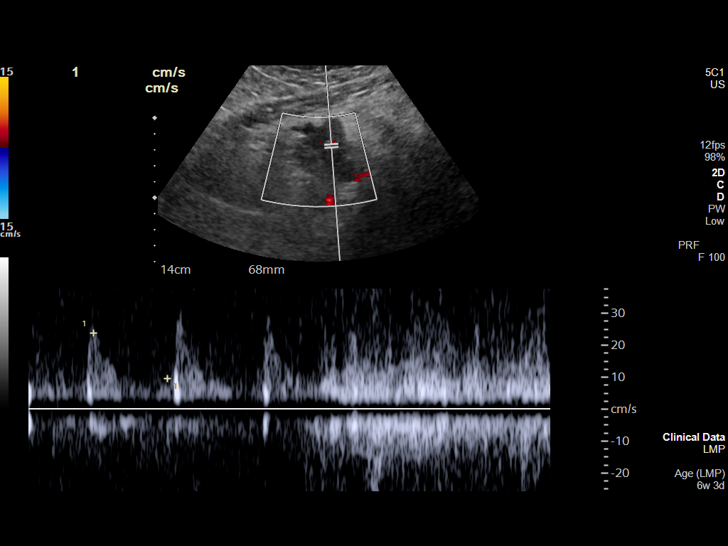
[im 38/112]
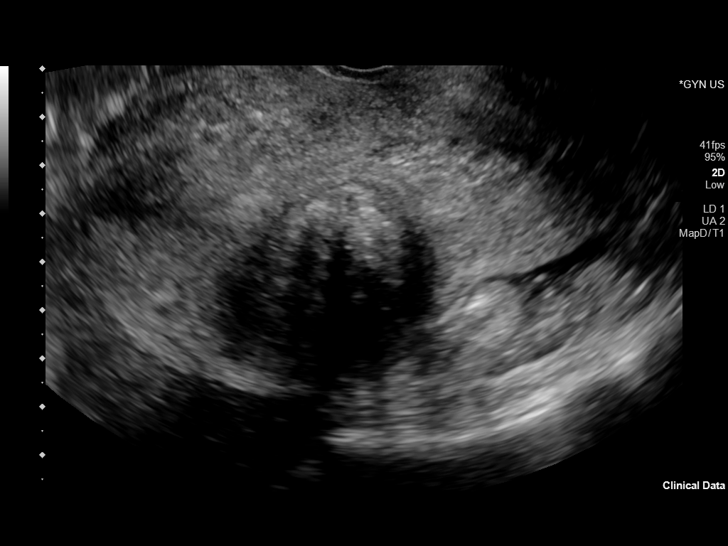
[im 46/112]
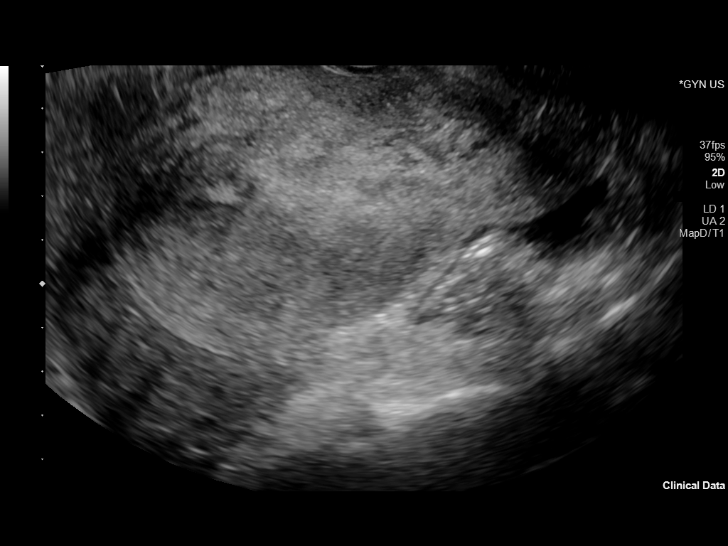
[im 58/112]
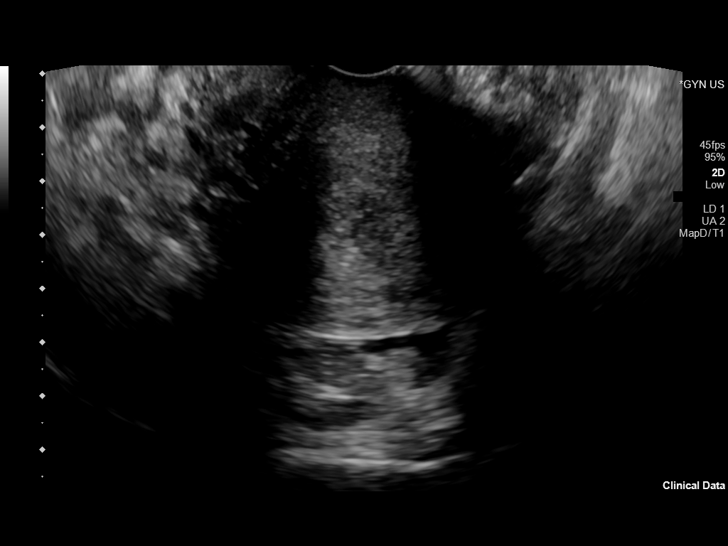
[im 66/112]
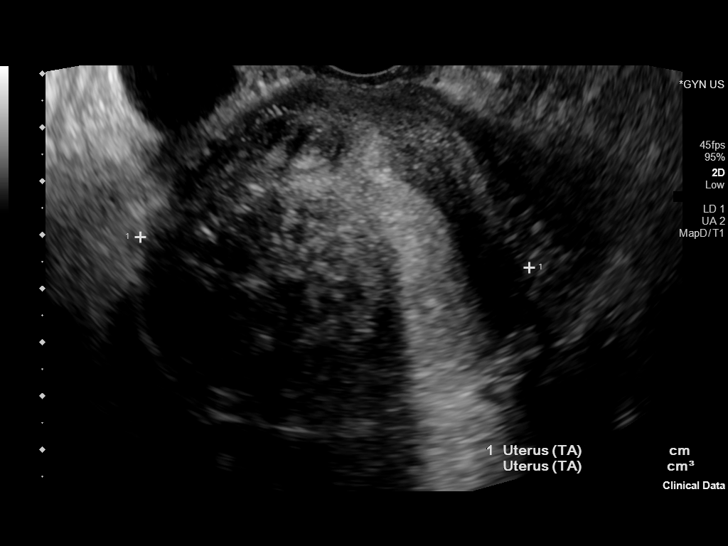
[im 75/112]
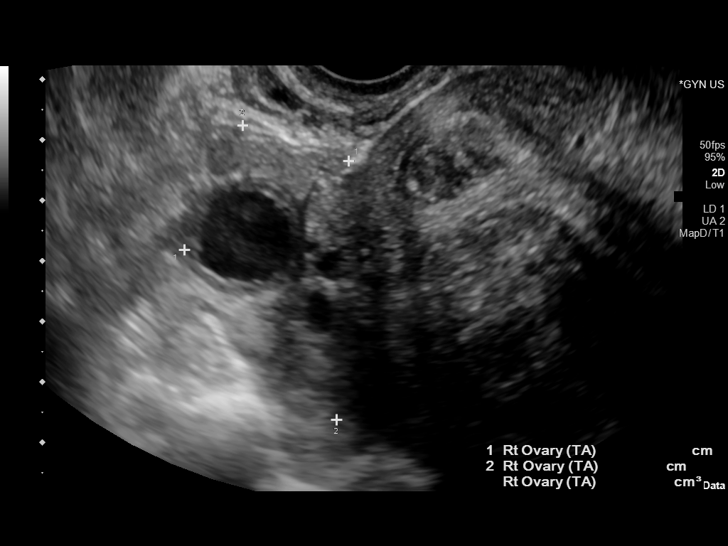
[im 83/112]
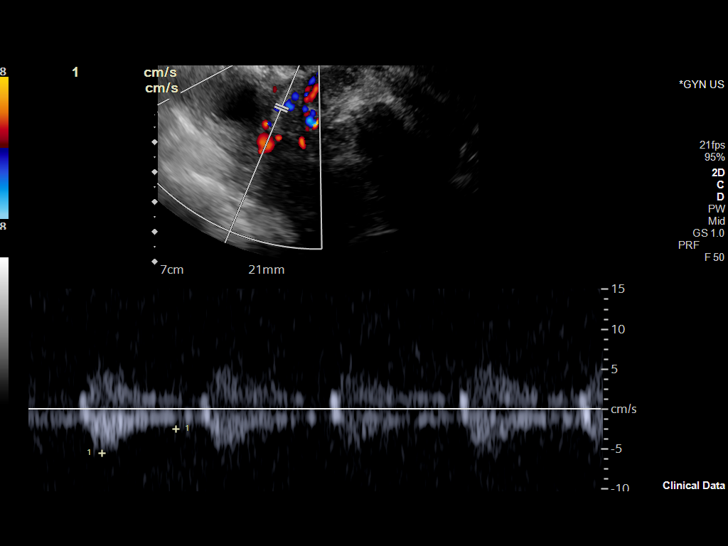
[im 91/112]
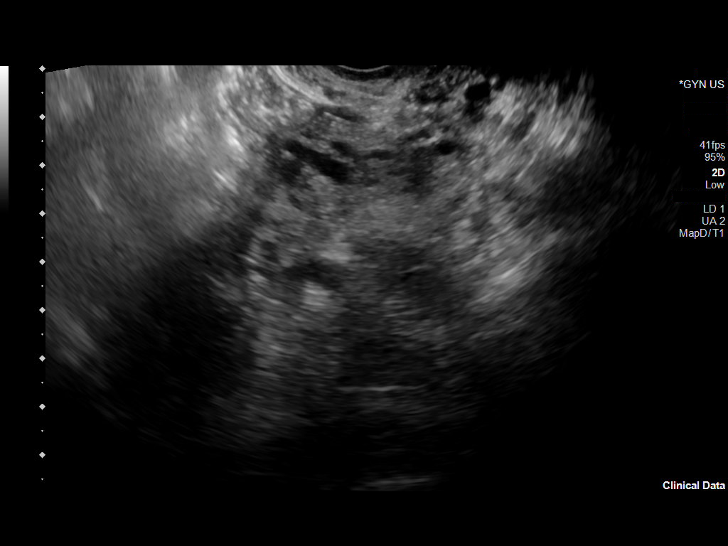
[im 99/112]
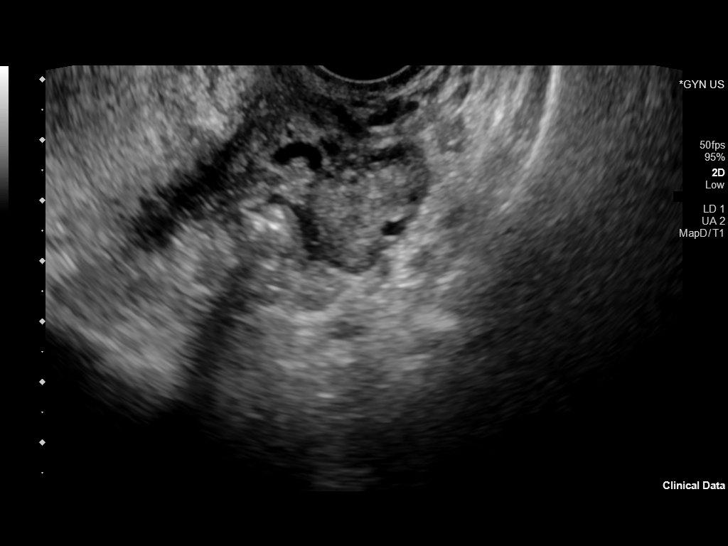
[im 107/112]
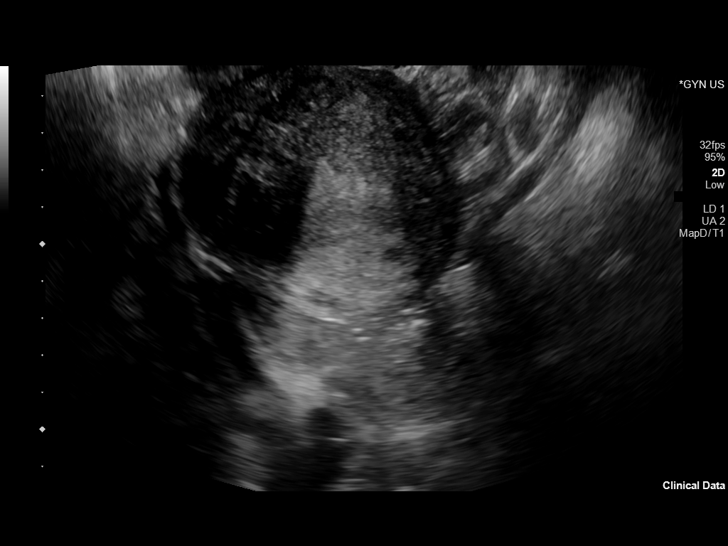

[13 of 28 positions shown; findings below may reference images not displayed]

FINDINGS: The uterus is anteverted and measures 12.3 x 6.4 x 6.6 cm. The
uterus is heterogeneous. There is a 4.6 x 4.5 x 5.0 cm posterior
body intramural fibroid with possible submucosal component. A
smaller anterior body intramural fibroid measuring 2.0 x 1.4 x
cm.

The endometrium is thickened and heterogeneous measuring 10 mm in
thickness. Echogenic content within the endometrium likely represent
blood products. There is a 1.2 x 0.7 x 0.5 cm sac-like structure in
the lower endometrium. No yolk sac or fetal pole identified within
this structure. This may represent a blighted ovum or abnormally low
located gestational sac. There is slight teardrop appearance of the
sac. Findings concerning for miscarriage in progress. Clinical
correlation recommended.

However, in the absence of a definite yolk sac or fetal pole this
structure is not a definitive gestational sac and therefore the
possibility of an ectopic pregnancy is not entirely excluded.

The ovaries are unremarkable.

Small free fluid in the pelvis.
IMPRESSION: Sac-like structure in the lower uterus with surrounding blood most
concerning for miscarriage in progress. Clinical correlation and
follow-up with HCG levels and ultrasound recommended.

## 2022-01-12 IMAGING — US US OB COMP LESS 14 WK
1 series · 13 of 28 positions shown · non-contrast
Comparison: None.

CLINICAL DATA: 44-year-old pregnant female with vaginal bleeding.
LMP: 01/18/2020 corresponding to an estimated gestational age of 6
weeks, 4 days.

EXAM:
OBSTETRIC <14 WK US AND TRANSVAGINAL OB US
TECHNIQUE: Both transabdominal and transvaginal ultrasound examinations were
performed for complete evaluation of the gestation as well as the
maternal uterus, adnexal regions, and pelvic cul-de-sac.
Transvaginal technique was performed to assess early pregnancy.

[Series 1: us ob comp less 14 wk · 112 acquisitions, 13 frames shown]
[im 5/112]
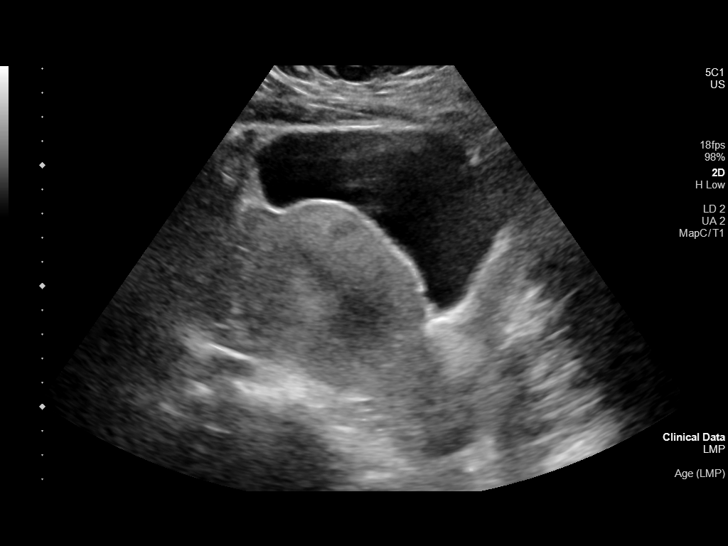
[im 13/112]
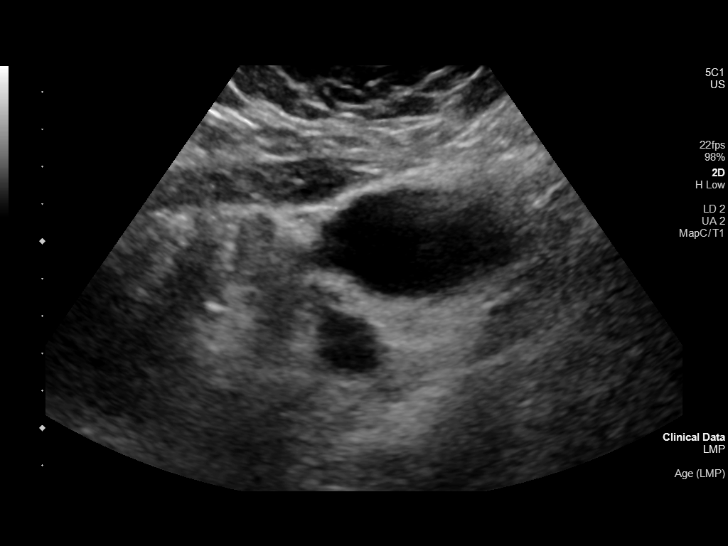
[im 21/112]
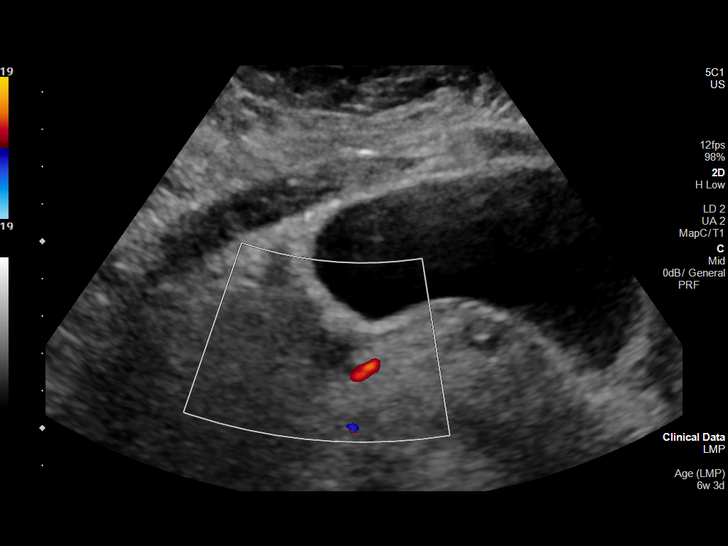
[im 29/112]
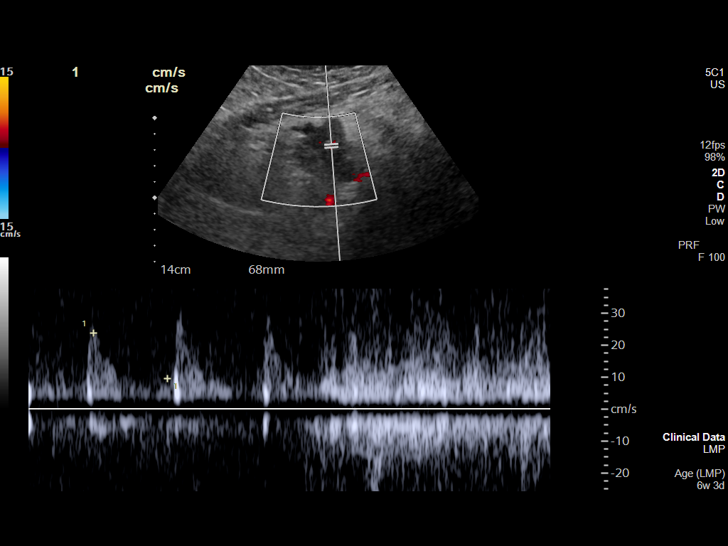
[im 38/112]
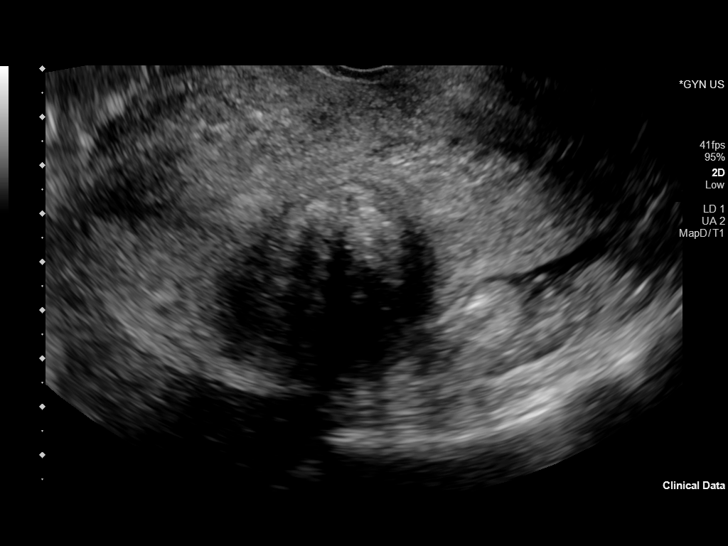
[im 46/112]
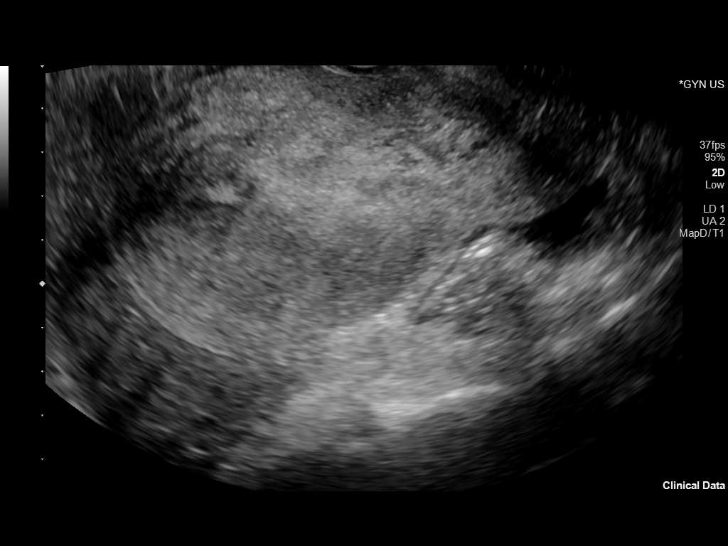
[im 58/112]
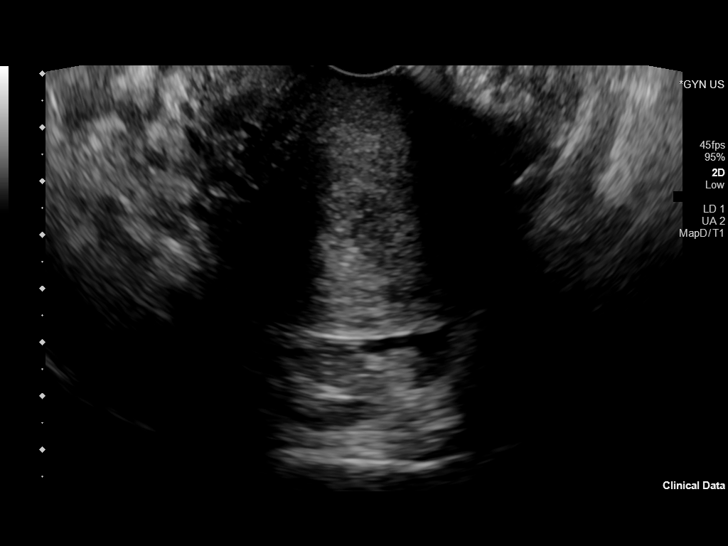
[im 66/112]
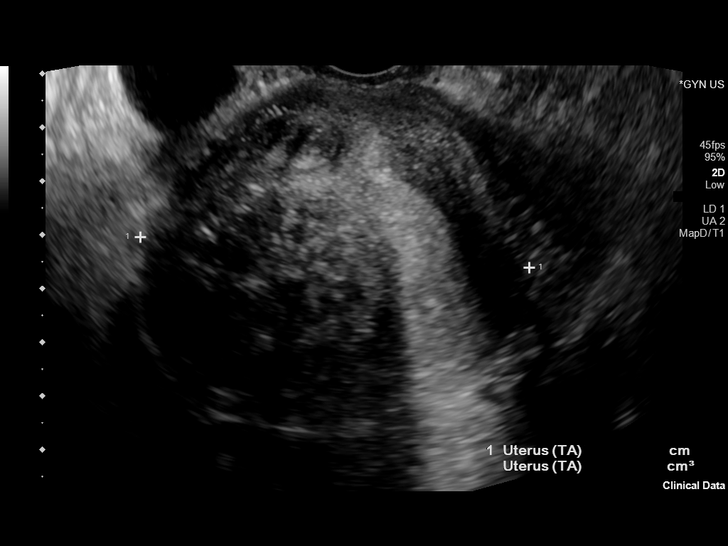
[im 75/112]
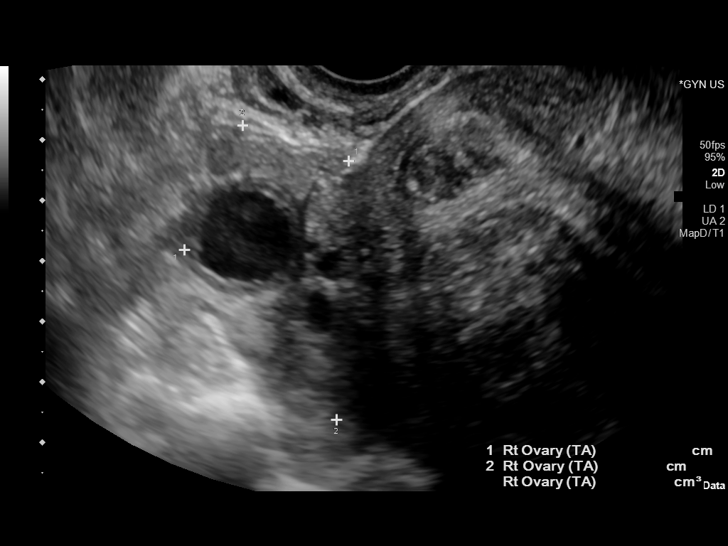
[im 83/112]
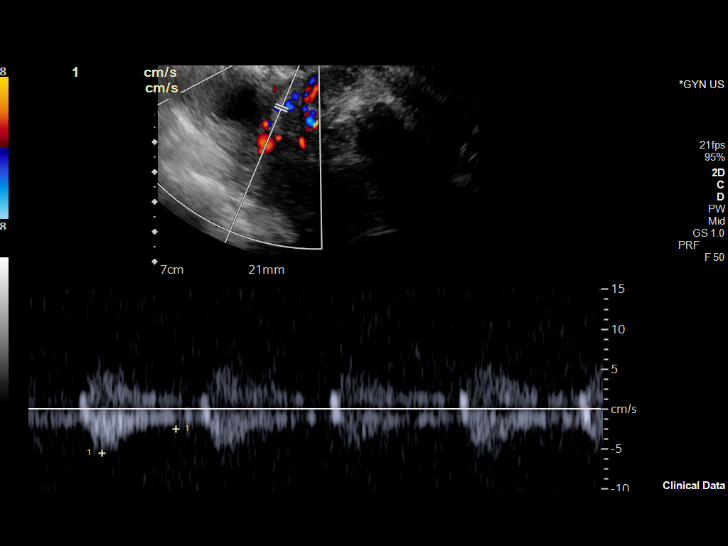
[im 91/112]
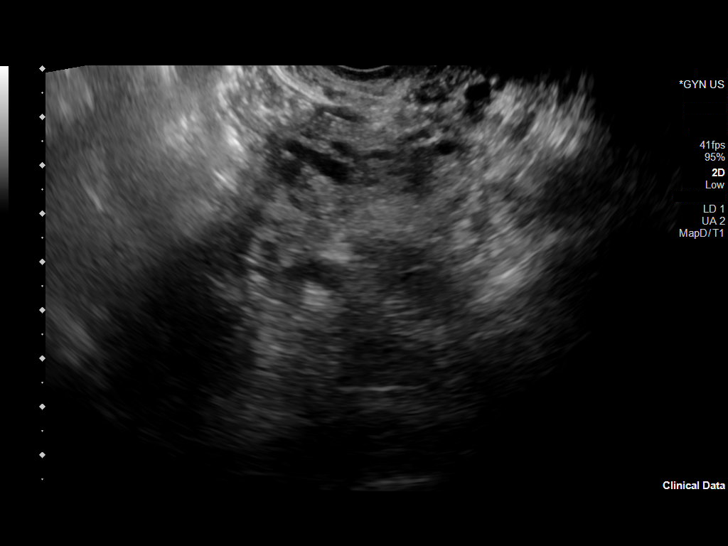
[im 99/112]
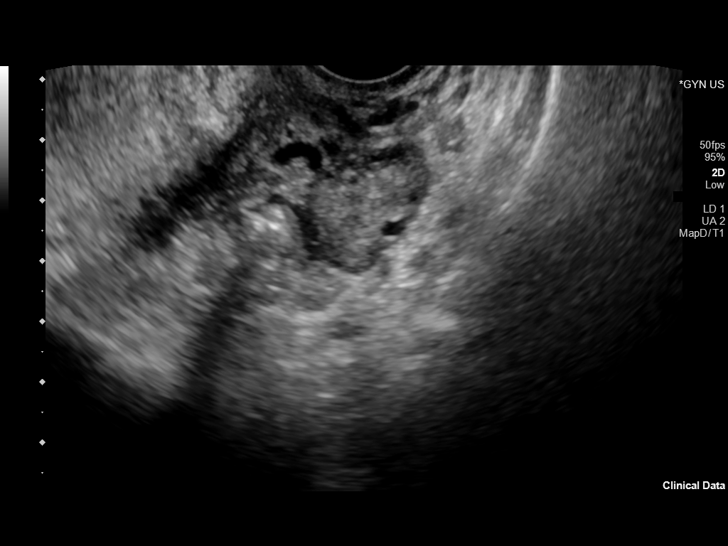
[im 107/112]
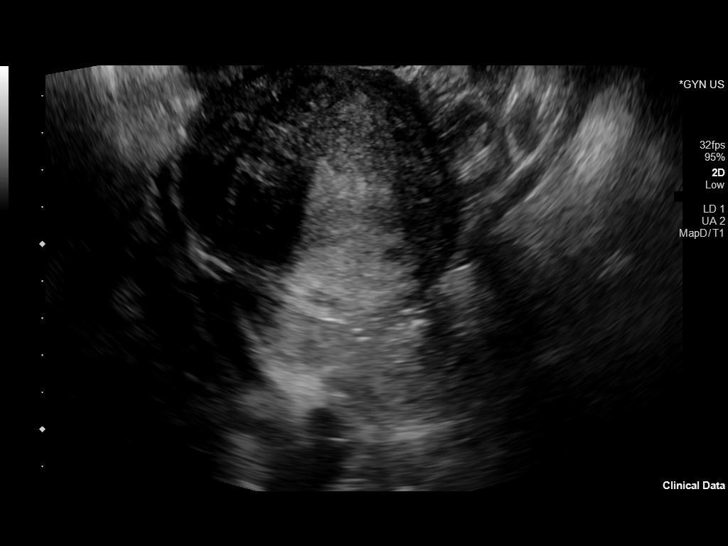

[13 of 28 positions shown; findings below may reference images not displayed]

FINDINGS: The uterus is anteverted and measures 12.3 x 6.4 x 6.6 cm. The
uterus is heterogeneous. There is a 4.6 x 4.5 x 5.0 cm posterior
body intramural fibroid with possible submucosal component. A
smaller anterior body intramural fibroid measuring 2.0 x 1.4 x
cm.

The endometrium is thickened and heterogeneous measuring 10 mm in
thickness. Echogenic content within the endometrium likely represent
blood products. There is a 1.2 x 0.7 x 0.5 cm sac-like structure in
the lower endometrium. No yolk sac or fetal pole identified within
this structure. This may represent a blighted ovum or abnormally low
located gestational sac. There is slight teardrop appearance of the
sac. Findings concerning for miscarriage in progress. Clinical
correlation recommended.

However, in the absence of a definite yolk sac or fetal pole this
structure is not a definitive gestational sac and therefore the
possibility of an ectopic pregnancy is not entirely excluded.

The ovaries are unremarkable.

Small free fluid in the pelvis.
IMPRESSION: Sac-like structure in the lower uterus with surrounding blood most
concerning for miscarriage in progress. Clinical correlation and
follow-up with HCG levels and ultrasound recommended.

## 2022-02-17 ENCOUNTER — Other Ambulatory Visit: Payer: Self-pay | Admitting: Medical

## 2022-03-19 ENCOUNTER — Other Ambulatory Visit: Payer: Self-pay | Admitting: Medical

## 2022-03-21 ENCOUNTER — Other Ambulatory Visit: Payer: Self-pay | Admitting: Medical

## 2022-04-09 ENCOUNTER — Other Ambulatory Visit: Payer: Self-pay | Admitting: Medical

## 2022-05-01 ENCOUNTER — Ambulatory Visit: Payer: BC Managed Care – PPO | Admitting: Medical

## 2022-05-01 ENCOUNTER — Encounter: Payer: Self-pay | Admitting: Medical

## 2022-05-01 VITALS — BP 133/80 | HR 71 | Temp 97.7°F | Resp 18 | Ht 67.0 in | Wt 214.8 lb

## 2022-05-01 DIAGNOSIS — I10 Essential (primary) hypertension: Secondary | ICD-10-CM | POA: Diagnosis not present

## 2022-05-01 DIAGNOSIS — J302 Other seasonal allergic rhinitis: Secondary | ICD-10-CM

## 2022-05-01 DIAGNOSIS — E119 Type 2 diabetes mellitus without complications: Secondary | ICD-10-CM

## 2022-05-01 DIAGNOSIS — R059 Cough, unspecified: Secondary | ICD-10-CM

## 2022-05-01 LAB — POC COVID19 BINAXNOW: SARS Coronavirus 2 Ag: NEGATIVE

## 2022-05-01 MED ORDER — BENZONATATE 100 MG PO CAPS: 100.0000 mg | ORAL_CAPSULE | Freq: Three times a day (TID) | ORAL | 0 refills | Status: DC | PRN

## 2022-05-01 MED ORDER — LEVOCETIRIZINE DIHYDROCHLORIDE 5 MG PO TABS: 5.0000 mg | ORAL_TABLET | Freq: Every evening | ORAL | 3 refills | Status: DC

## 2022-05-01 MED ORDER — MONTELUKAST SODIUM 10 MG PO TABS: 10.0000 mg | ORAL_TABLET | Freq: Every day | ORAL | 3 refills | Status: DC

## 2022-05-01 MED ORDER — FLUTICASONE PROPIONATE 50 MCG/ACT NA SUSP: 2.0000 | Freq: Every day | NASAL | 3 refills | Status: DC

## 2022-05-01 NOTE — Patient Instructions (Addendum)
Recent allergic rhinitis times signs and symptoms: History of allergies year-round.  Some concern for possible early ethmoid sinusitis.  Prescribing Xyzal antihistamine, montelukast and Flonase nasal spray.  Between now and Friday if your sinus pressure does not improve let me know before the weekend.  In that event would prescribe azithromycin.  Diabetes and on review last A1c was September 2022.  Continue metformin presently and low sugar diet.  We will get metabolic panel and I3B today.  Hypertension-blood pressure reasonably controlled today.  Continue losartan, amlodipine and metoprolol.  Follow-up date to be determined after lab review.

## 2022-05-01 NOTE — Progress Notes (Signed)
Subjective:    Patient ID: Nicole Ryan, female    DOB: 08-10-1976, 46 y.o.   MRN: [id number]  HPI  Pt states since Friday got scratchy, pnd, hacky cough, nasal congestion and sinus pressure.  Pt states no fever, no chills, no sweats or body aches.  Pt thinks has year round allergies. By hx year round in the past.   On review saw that had not seen pt in one year.   Pt is diabetic. Pt is only on metformin. Last year a1c was 7.4.  Htn- bp is better on recheck. She is taking her meds.   Review of Systems  Constitutional:  Negative for chills, fatigue and fever.  HENT:  Positive for congestion, postnasal drip and sinus pressure. Negative for ear pain.   Respiratory:  Positive for cough. Negative for chest tightness, shortness of breath and wheezing.   Cardiovascular:  Negative for chest pain and palpitations.  Gastrointestinal:  Negative for abdominal pain.  Genitourinary:  Negative for dysuria, flank pain and frequency.  Musculoskeletal:  Negative for back pain.  Skin:  Negative for rash.  Neurological:  Negative for dizziness, speech difficulty, numbness and headaches.  Psychiatric/Behavioral:  Negative for behavioral problems and confusion.     Past Medical History:  Diagnosis Date   Allergy    GERD (gastroesophageal reflux disease)    heart burn. 2-3 times a week.   Hypertension      Social History   Socioeconomic History   Marital status: Single    Spouse name: Not on file   Number of children: Not on file   Years of education: Not on file   Highest education level: Not on file  Occupational History   Occupation: Control and instrumentation engineer  Tobacco Use   Smoking status: Never   Smokeless tobacco: Never  Vaping Use   Vaping Use: Never used  Substance and Sexual Activity   Alcohol use: Not Currently   Drug use: Never   Sexual activity: Not Currently    Birth control/protection: None  Other Topics Concern   Not on file  Social History Narrative   Not on file    Social Determinants of Health   Financial Resource Strain: Not on file  Food Insecurity: Not on file  Transportation Needs: Not on file  Physical Activity: Not on file  Stress: Not on file  Social Connections: Not on file  Intimate Partner Violence: Not on file    Past Surgical History:  Procedure Laterality Date   BREAST BIOPSY Bilateral    breast surg Bilateral    Beign tumors both breast    Family History  Problem Relation Age of Onset   Aneurysm Mother    Hypertension Mother    Diabetes Mother     No Known Allergies  Current Outpatient Medications on File Prior to Visit  Medication Sig Dispense Refill   albuterol (VENTOLIN HFA) 108 (90 Base) MCG/ACT inhaler Inhale 2 puffs into the lungs every 6 (six) hours as needed. 18 g 0   amLODipine (NORVASC) 5 MG tablet Take 1 tablet by mouth once daily 30 tablet 0   atorvastatin (LIPITOR) 10 MG tablet Take 1 tablet by mouth once daily 90 tablet 0   losartan (COZAAR) 100 MG tablet Take 1 tablet by mouth once daily 90 tablet 0   metFORMIN (GLUCOPHAGE) 1000 MG tablet Take 1 tablet (1,000 mg total) by mouth 2 (two) times daily with a meal. 60 tablet 0   metoprolol succinate (TOPROL-XL) 50 MG 24 hr  tablet TAKE 1 TABLET BY MOUTH ONCE DAILY** TAKE IMMEDIATELY FOLLOWING A MEAL** 30 tablet 0   Multiple Vitamin (MULTIVITAMIN WITH MINERALS) TABS tablet Take 1 tablet by mouth daily.     canagliflozin (INVOKANA) 300 MG TABS tablet Take 1 tablet (300 mg total) by mouth daily before breakfast. (Patient not taking: Reported on 05/01/2022) 30 tablet 3   sitaGLIPtin (JANUVIA) 50 MG tablet Take 1 tablet (50 mg total) by mouth daily. (Patient not taking: Reported on 05/01/2022) 30 tablet 3   No current facility-administered medications on file prior to visit.    BP 133/80   Pulse 71   Temp 97.7 F (36.5 C) (Temporal)   Resp 18   Ht '5\' 7"'$  (1.702 m)   Wt 214 lb 12.8 oz (97.4 kg)   SpO2 100%   BMI 33.64 kg/m        Objective:   Physical  Exam  General- No acute distress. Pleasant patient. Neck- Full range of motion, no jvd Lungs- Clear, even and unlabored. Heart- regular rate and rhythm. Neurologic- CNII- XII grossly intact.  Heent- mild ethmoid sinus pressure to palpation. Boggy turbinates and pnd.      Assessment & Plan:   Patient Instructions  Recent allergic rhinitis times signs and symptoms: History of allergies year-round.  Some concern for possible early ethmoid sinusitis.  Prescribing Xyzal antihistamine, montelukast and Flonase nasal spray.  Between now and Friday if your sinus pressure does not improve let me know before the weekend.  In that event would prescribe azithromycin.  Diabetes and on review last A1c was September 2022.  Continue metformin presently and low sugar diet.  We will get metabolic panel and Z4M today.  Hypertension-blood pressure reasonably controlled today.  Continue losartan, amlodipine and metoprolol.  Follow-up date to be determined after lab review.   [name], PA-C

## 2022-05-02 LAB — HEMOGLOBIN A1C: Hgb A1c MFr Bld: 9.8 % — ABNORMAL HIGH (ref 4.6–6.5)

## 2022-05-02 LAB — COMPREHENSIVE METABOLIC PANEL
ALT: 31 U/L (ref 0–35)
AST: 38 U/L — ABNORMAL HIGH (ref 0–37)
Albumin: 3.9 g/dL (ref 3.5–5.2)
Alkaline Phosphatase: 106 U/L (ref 39–117)
BUN: 15 mg/dL (ref 6–23)
CO2: 21 mEq/L (ref 19–32)
Calcium: 10.3 mg/dL (ref 8.4–10.5)
Chloride: 103 mEq/L (ref 96–112)
Creatinine, Ser: 0.94 mg/dL (ref 0.40–1.20)
GFR: 73.82 mL/min (ref >60.00)
Glucose, Bld: 143 mg/dL — ABNORMAL HIGH (ref 70–99)
Potassium: 4.5 mEq/L (ref 3.5–5.1)
Sodium: 136 mEq/L (ref 135–145)
Total Bilirubin: 0.3 mg/dL (ref 0.2–1.2)
Total Protein: 7.8 g/dL (ref 6.0–8.3)

## 2022-05-03 MED ORDER — METFORMIN HCL 1000 MG PO TABS: 1000.0000 mg | ORAL_TABLET | Freq: Two times a day (BID) | ORAL | 11 refills | Status: DC

## 2022-05-03 MED ORDER — CANAGLIFLOZIN 300 MG PO TABS: 300.0000 mg | ORAL_TABLET | Freq: Every day | ORAL | 3 refills | Status: DC

## 2022-05-03 NOTE — Addendum Note (Signed)
Addended by: Anabel Halon on: 05/03/2022 06:23 PM   Modules accepted: Orders

## 2022-05-03 NOTE — Addendum Note (Signed)
Addended by: Anabel Halon on: 05/03/2022 06:24 PM   Modules accepted: Orders

## 2022-06-02 ENCOUNTER — Ambulatory Visit
Admission: RE | Admit: 2022-06-02 | Discharge: 2022-06-02 | Disposition: A | Discharge status: Home / Self Care | Payer: BC Managed Care – PPO | Source: Ambulatory Visit | Attending: Urgent Care | Admitting: Urgent Care

## 2022-06-02 VITALS — BP 177/94 | HR 72 | Temp 98.1°F | Resp 18

## 2022-06-02 DIAGNOSIS — D259 Leiomyoma of uterus, unspecified: Secondary | ICD-10-CM | POA: Diagnosis not present

## 2022-06-02 DIAGNOSIS — N920 Excessive and frequent menstruation with regular cycle: Secondary | ICD-10-CM | POA: Diagnosis not present

## 2022-06-02 HISTORY — DX: Type 2 diabetes mellitus without complications: E11.9

## 2022-06-02 LAB — POCT URINE PREGNANCY: Preg Test, Ur: NEGATIVE

## 2022-06-02 LAB — POCT URINALYSIS DIP (MANUAL ENTRY)
Bilirubin, UA: NEGATIVE
Glucose, UA: NEGATIVE mg/dL
Ketones, POC UA: NEGATIVE mg/dL
Leukocytes, UA: NEGATIVE
Nitrite, UA: NEGATIVE
Protein Ur, POC: >=300 mg/dL — AB
Spec Grav, UA: 1.025 (ref 1.010–1.025)
Urobilinogen, UA: 0.2 E.U./dL
pH, UA: 6 (ref 5.0–8.0)

## 2022-06-02 MED ORDER — NORETHINDRONE 0.35 MG PO TABS: ORAL_TABLET | ORAL | 0 refills | Status: DC

## 2022-06-02 MED ORDER — MEFENAMIC ACID 250 MG PO CAPS: ORAL_CAPSULE | ORAL | 0 refills | Status: DC

## 2022-06-02 NOTE — ED Provider Notes (Signed)
[facility]    CSN: [id number] Arrival date & time: 06/02/22  1093      History   Chief Complaint Chief Complaint  Patient presents with  . Vaginal Bleeding  . Appointment    0930    HPI Nicole Ryan is a 46 y.o. female.   46 year old female presents today due to concerns of heavy bleeding with blood clots during her normal menstrual cycle.  This is actually been an issue for numerous years.  Last time she saw her gynecologist for this was in 2021.  She had a uterine ultrasound at that time but confirmed several large uterine fibroids.  It was recommended she consider an IUD versus surgical.  Patient states she never followed up and no treatments have been performed.  Per documentation, norethindrone was recommended, but patient states she never took.  She does not smoke.  States she is having some cramping today, associated with her normal menstrual cycle.  Denies any concerns for an STD.  Did have some vaginal discharge prior, but believes this has resolved.   Vaginal Bleeding   Past Medical History:  Diagnosis Date  . Allergy   . Diabetes mellitus without complication ([place]   . GERD (gastroesophageal reflux disease)    heart burn. 2-3 times a week.  . Hypertension     Patient Active Problem List   Diagnosis Date Noted  . Hypertension 01/04/2021  . Nausea 01/04/2021    Past Surgical History:  Procedure Laterality Date  . BREAST BIOPSY Bilateral   . breast surg Bilateral    Beign tumors both breast    OB History     Gravida  5   Para  3   Term  3   Preterm      AB  1   Living  3      SAB  1   IAB      Ectopic      Multiple      Live Births  3            Home Medications    Prior to Admission medications   Medication Sig Start Date End Date Taking? Authorizing Provider  amLODipine (NORVASC) 5 MG tablet Take 1 tablet by mouth once daily 03/21/22  Yes Saguier, Percell Miller, PA-C  atorvastatin (LIPITOR) 10 MG tablet Take 1 tablet  by mouth once daily 12/04/21  Yes Saguier, Percell Miller, PA-C  fluticasone [facility]) 50 MCG/ACT nasal spray Place 2 sprays into both nostrils daily. 05/01/22  Yes Saguier, Percell Miller, PA-C  levocetirizine (XYZAL) 5 MG tablet Take 1 tablet (5 mg total) by mouth every evening. 05/01/22  Yes Saguier, Percell Miller, PA-C  losartan (COZAAR) 100 MG tablet Take 1 tablet by mouth once daily 03/19/22  Yes Saguier, Percell Miller, PA-C  metFORMIN (GLUCOPHAGE) 1000 MG tablet Take 1 tablet (1,000 mg total) by mouth 2 (two) times daily with a meal. 05/03/22  Yes Saguier, Percell Miller, PA-C  metoprolol succinate (TOPROL-XL) 50 MG 24 hr tablet TAKE 1 TABLET BY MOUTH ONCE DAILY** TAKE IMMEDIATELY FOLLOWING A MEAL** 03/21/22  Yes Saguier, Percell Miller, PA-C  albuterol (VENTOLIN HFA) 108 (90 Base) MCG/ACT inhaler Inhale 2 puffs into the lungs every 6 (six) hours as needed. 08/19/19   Saguier, Percell Miller, PA-C  Mefenamic Acid 250 MG CAPS Take 2 caps PO at onset of menses. May repeat every 8 hours as needed for pain/ cramping for maximum 7 days use. 06/02/22  Yes Nary Sneed L, PA  norethindrone (ORTHO MICRONOR) 0.35 MG tablet Take  one tab daily while on menses 06/02/22  Yes Jovani Colquhoun L, PA    Family History Family History  Problem Relation Age of Onset  . Aneurysm Mother   . Hypertension Mother   . Diabetes Mother     Social History Social History   Tobacco Use  . Smoking status: Never  . Smokeless tobacco: Never  Vaping Use  . Vaping Use: Never used  Substance Use Topics  . Alcohol use: Not Currently    Comment: occasionally  . Drug use: Never     Allergies   Patient has no known allergies.   Review of Systems Review of Systems  Genitourinary:  Positive for vaginal bleeding.     Physical Exam Triage Vital Signs ED Triage Vitals  Enc Vitals Group     BP 06/02/22 0930 (!) 177/94     Pulse Rate 06/02/22 0930 72     Resp 06/02/22 0930 18     Temp 06/02/22 0930 98.1 F (36.7 C)     Temp Source 06/02/22 0930 Oral     SpO2  06/02/22 0930 96 %     Weight --      Height --      Head Circumference --      Peak Flow --      Pain Score 06/02/22 0932 0     Pain Loc --      Pain Edu? --      Excl. in [place]? --    No data found.  Updated Vital Signs BP (!) 177/94 Comment: missed HTN med dose last night  Pulse 72   Temp 98.1 F (36.7 C) (Oral)   Resp 18   LMP 05/27/2022 (Approximate)   SpO2 96%   Breastfeeding No   Visual Acuity Right Eye Distance:   Left Eye Distance:   Bilateral Distance:    Right Eye Near:   Left Eye Near:    Bilateral Near:     Physical Exam   UC Treatments / Results  Labs (all labs ordered are listed, but only abnormal results are displayed) Labs Reviewed  POCT URINALYSIS DIP (MANUAL ENTRY) - Abnormal; Notable for the following components:      Result Value   Blood, UA large (*)    Protein Ur, POC >=300 (*)    All other components within normal limits  POCT URINE PREGNANCY    EKG   Radiology No results found.  Procedures Procedures (including critical care time)  Medications Ordered in UC Medications - No data to display  Initial Impression / Assessment and Plan / UC Course  I have reviewed the triage vital signs and the nursing notes.  Pertinent labs & imaging results that were available during my care of the patient were reviewed by me and considered in my medical decision making (see chart for details).     *** Final Clinical Impressions(s) / UC Diagnoses   Final diagnoses:  Menorrhagia with regular cycle  Uterine leiomyoma, unspecified location     Discharge Instructions      You had a uterine ultrasound in 2021 showing numerous uterine fibroids.  These often cause heavy menstrual bleeding with clots. To help with your cramping, I have called in Ponstel.  This can be taken during her menstrual period.  I have also called in a progesterone only medication to help lighten your bleeding.  It is imperative that you follow-up with your gynecologist  to further discuss other options, including IUD versus ablation.   ED  Prescriptions     Medication Sig Dispense Auth. Provider   norethindrone (ORTHO MICRONOR) 0.35 MG tablet Take one tab daily while on menses 28 tablet Rieley Khalsa L, PA   Mefenamic Acid 250 MG CAPS Take 2 caps PO at onset of menses. May repeat every 8 hours as needed for pain/ cramping for maximum 7 days use. 28 capsule Adelard Sanon L, PA      PDMP not reviewed this encounter.

## 2022-06-02 NOTE — ED Triage Notes (Signed)
Reports being on her normal cycle, but vaginal bleeding is much heavier than usual. States has been having clots since cycle started (states normal for her since giving birth to 3rd child); but this AM, states she gets a short-lived cramp in left thigh, followed by very large clot, x2 episodes. Denies any lightheadedness. Also reports abdominal bloating yesterday. States not using tampons. Has been using approx 3-4 pads per day, sometimes has to change within an hr.

## 2022-06-02 NOTE — Discharge Instructions (Addendum)
You had a uterine ultrasound in 2021 showing numerous uterine fibroids.  These often cause heavy menstrual bleeding with clots. To help with your cramping, I have called in Ponstel.  This can be taken during her menstrual period.  I have also called in a progesterone only medication to help lighten your bleeding.  It is imperative that you follow-up with your gynecologist to further discuss other options, including IUD versus ablation.

## 2022-06-19 ENCOUNTER — Other Ambulatory Visit: Payer: Self-pay | Admitting: Medical

## 2022-06-21 ENCOUNTER — Encounter: Payer: Self-pay | Admitting: Obstetrics and Gynecology

## 2022-06-21 ENCOUNTER — Ambulatory Visit: Payer: BC Managed Care – PPO | Admitting: Obstetrics and Gynecology

## 2022-06-21 VITALS — BP 173/103 | HR 83 | Ht 67.0 in | Wt 217.0 lb

## 2022-06-21 DIAGNOSIS — D219 Benign neoplasm of connective and other soft tissue, unspecified: Secondary | ICD-10-CM

## 2022-06-21 DIAGNOSIS — N920 Excessive and frequent menstruation with regular cycle: Secondary | ICD-10-CM

## 2022-06-21 DIAGNOSIS — Z01419 Encounter for gynecological examination (general) (routine) without abnormal findings: Secondary | ICD-10-CM

## 2022-06-21 NOTE — Progress Notes (Signed)
GYNECOLOGY OFFICE VISIT NOTE  History:   Nicole Ryan is a 46 y.o. [phone] here today for discussion of management of her fibroid.   She has heavy bleeding with the fibroid. Passing blood clots and heavier bleeding since birth of her last child (9yo). Palm size clots along with heavy bleeding and it lasts 7 days. Tylenol and ibuprofen don't help the cramps. Uses pads (tampons don't stay in), changes them every hour.   Her last imaging for her pelvis was 02/2020. At that time she had two main fibroids - 5 cm possible submucosal component but intramural fibroid and a 2 cm intramural fibroid. She was seen in October 2021 - she had wanted LNGIUD but did not follow up to have it placed.   She was seen in the urgent care in October 2023. No imaging or relevant labwork was done at that time. Last HGB was from one year ago and was 10.5.   She is on her period today.   No longer desires child-bearing.     Past Medical History:  Diagnosis Date   Allergy    Diabetes mellitus without complication (HCC)    GERD (gastroesophageal reflux disease)    heart burn. 2-3 times a week.   Hypertension     Past Surgical History:  Procedure Laterality Date   BREAST BIOPSY Bilateral    breast surg Bilateral    Beign tumors both breast    The following portions of the patient's history were reviewed and updated as appropriate: allergies, current medications, past family history, past medical history, past social history, past surgical history and problem list.   Health Maintenance:   Normal pap and negative HRHPV on 10/2017.   Diagnosis  Date Value Ref Range Status  11/07/2017   Final   NEGATIVE FOR INTRAEPITHELIAL LESIONS OR MALIGNANCY.     Normal mammogram on 08/2019.   Review of Systems:  Pertinent items noted in HPI and remainder of comprehensive ROS otherwise negative.  Physical Exam:  BP (!) 173/103   Pulse 83   Ht '5\' 7"'$  (1.702 m)   Wt 217 lb (98.4 kg)   LMP 05/27/2022  (Approximate)   BMI 33.99 kg/m  CONSTITUTIONAL: Well-developed, well-nourished female in no acute distress.  HEENT:  Normocephalic, atraumatic. External right and left ear normal. No scleral icterus.  NECK: Normal range of motion, supple, no masses noted on observation SKIN: No rash noted. Not diaphoretic. No erythema. No pallor. MUSCULOSKELETAL: Normal range of motion. No edema noted. NEUROLOGIC: Alert and oriented to person, place, and time. Normal muscle tone coordination. No cranial nerve deficit noted. PSYCHIATRIC: Normal mood and affect. Normal behavior. Normal judgment and thought content.  PELVIC: Deferred  Labs and Imaging No results found for this or any previous visit (from the past 168 hour(s)). No results found.  Assessment and Plan:  Nicole Ryan was seen today for follow-up.  Diagnoses and all orders for this visit:  Fibroid -     US PELVIC COMPLETE WITH TRANSVAGINAL; Future - Uterine fibroids: The patient's fibroids are symptomatic and treatment options of expectant management, medical therapy, and surgical therapy were discussed. - Expectant management - The patient's fibroids were discussed and expectant management was offered with strict precautions. - TXA - this medication was discussed as a means to control vaginal bleeding. Discussed it would not impact growth of the fibroids either way. Its main advantage is avoiding hormonal or surgical therapy and gives an option for therapy besides expectant management.  - We discussed progesterone  only options including - POP, Depo Provera and Lng-IUD.  We reviewed risks and benefits and proper use. Progesterone Only Birth Control Pills (POP)- The use of progesterone only birth control pills was discussed with the patient. The control of fibroid symptoms was discussed and the risks/benefits of therapy were discussed. The fact that this type of therapy does not contain estrogen was discussed with the patient.  Proper use was also  discussed.  - We discussed the GnRH antagonists. Reviewed both short term and long term impact of these medications.  - We discussed surgical/procedural options available: RFA (I.e. Sonata) +/- endometrial ablation, [place], myomectomy and hysterectomy. We discussed the risks and benefits for each of these specific procedures. For [place], recommended preop MRI and referral to interventional radiology.  We discussed the types and sizes of fibroids that are candidates for hysteroscopic resection of fibroids as well. Hers do not qualify for hysteroscopic resection.  - Following counseling, the patient would like to  recheck an [place] to see if that fine tunes our options. She knows she does not prefer major surgery and is leaning toward the less invasive options I.e. RFA/ablation vs [place] or the Morrison antagonists.  - She will come back after her [place] to have annual and discuss results and options again. She may try the POP given to her by urgent care in the interim.    Menorrhagia with regular cycle -     US PELVIC COMPLETE WITH TRANSVAGINAL; Future    Routine preventative health maintenance measures emphasized. Please refer to After Visit Summary for other counseling recommendations.   Return for 1-2 months for annual. [place] before that. Radene Gunning, MD, [place] for [facility], [place]

## 2022-06-22 ENCOUNTER — Other Ambulatory Visit: Payer: Self-pay | Admitting: Medical

## 2022-07-10 IMAGING — CR DG FOOT COMPLETE 3+V*R*
3 series · 3 of 3 positions shown · non-contrast
Comparison: None.

CLINICAL DATA: Intermittent right foot pain

EXAM:
RIGHT FOOT COMPLETE - 3+ VIEW

[t foot ap right]
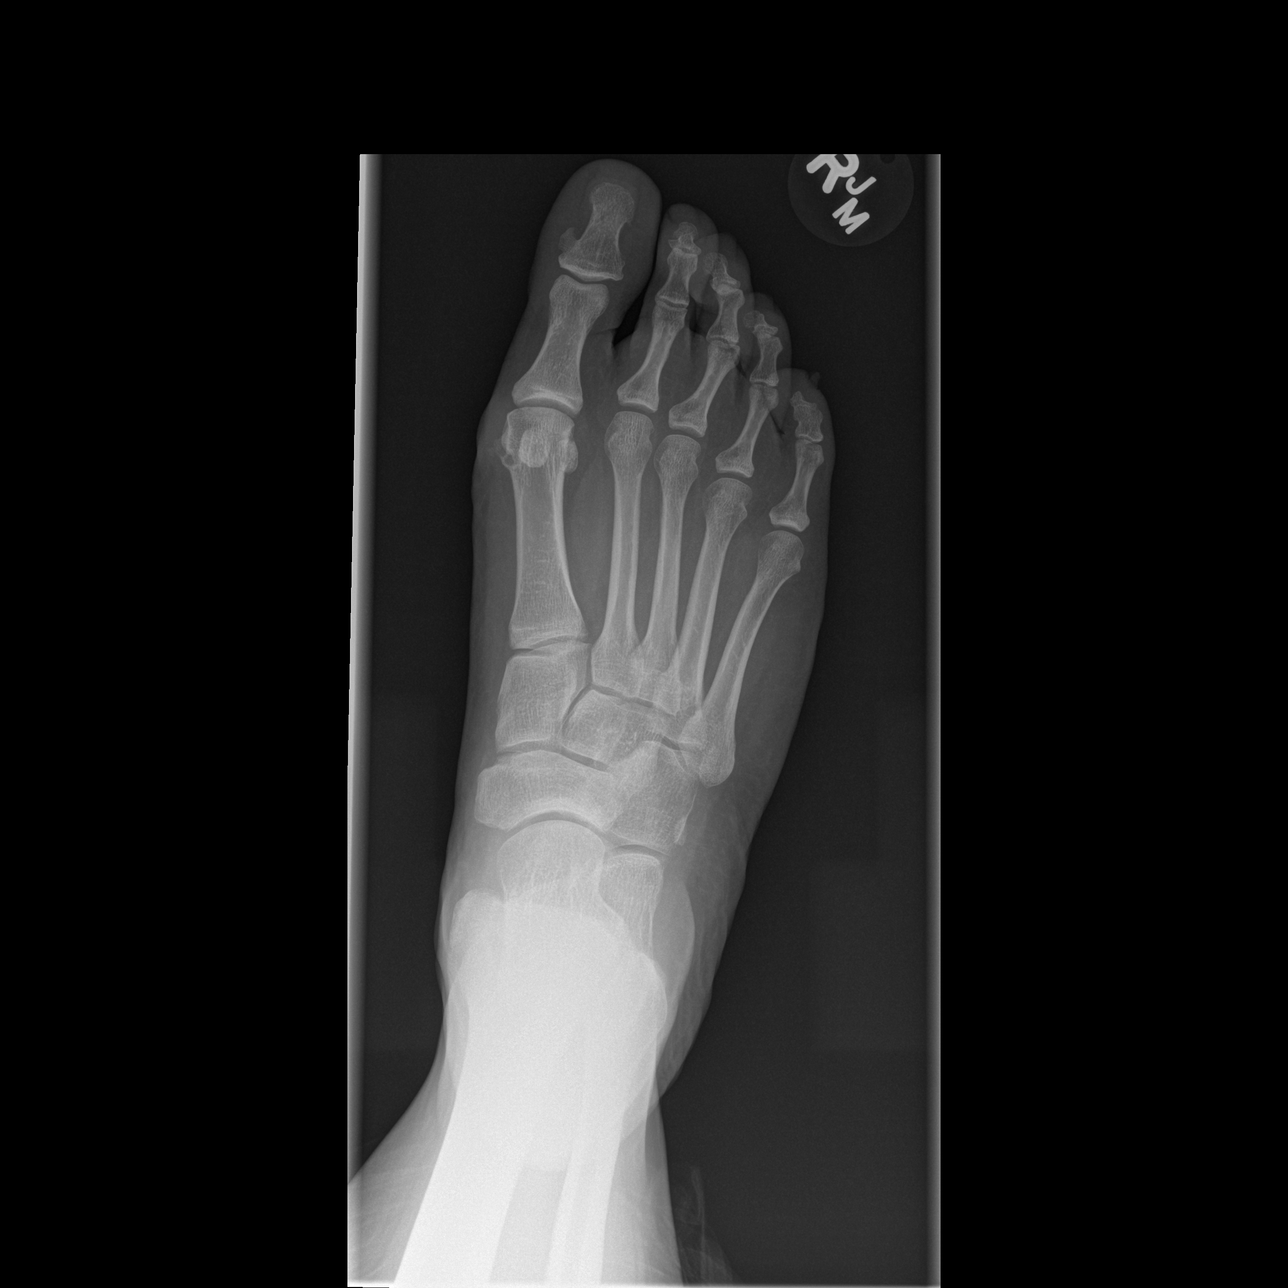

[t foot oblique right]
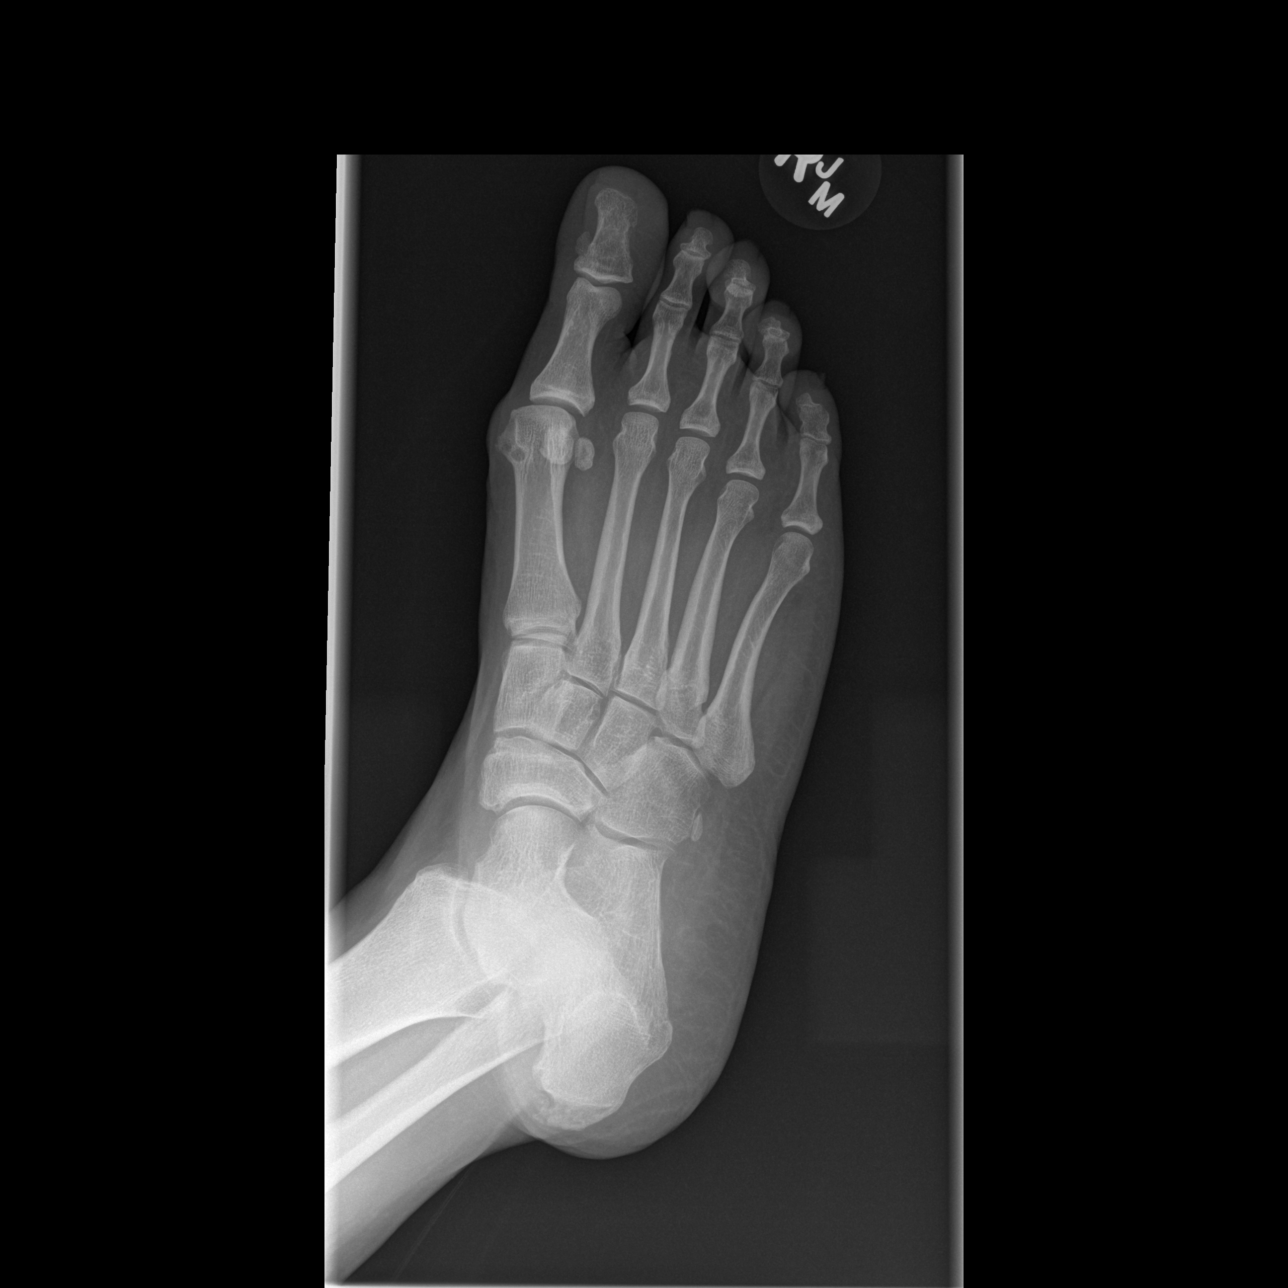

[t foot lat right]
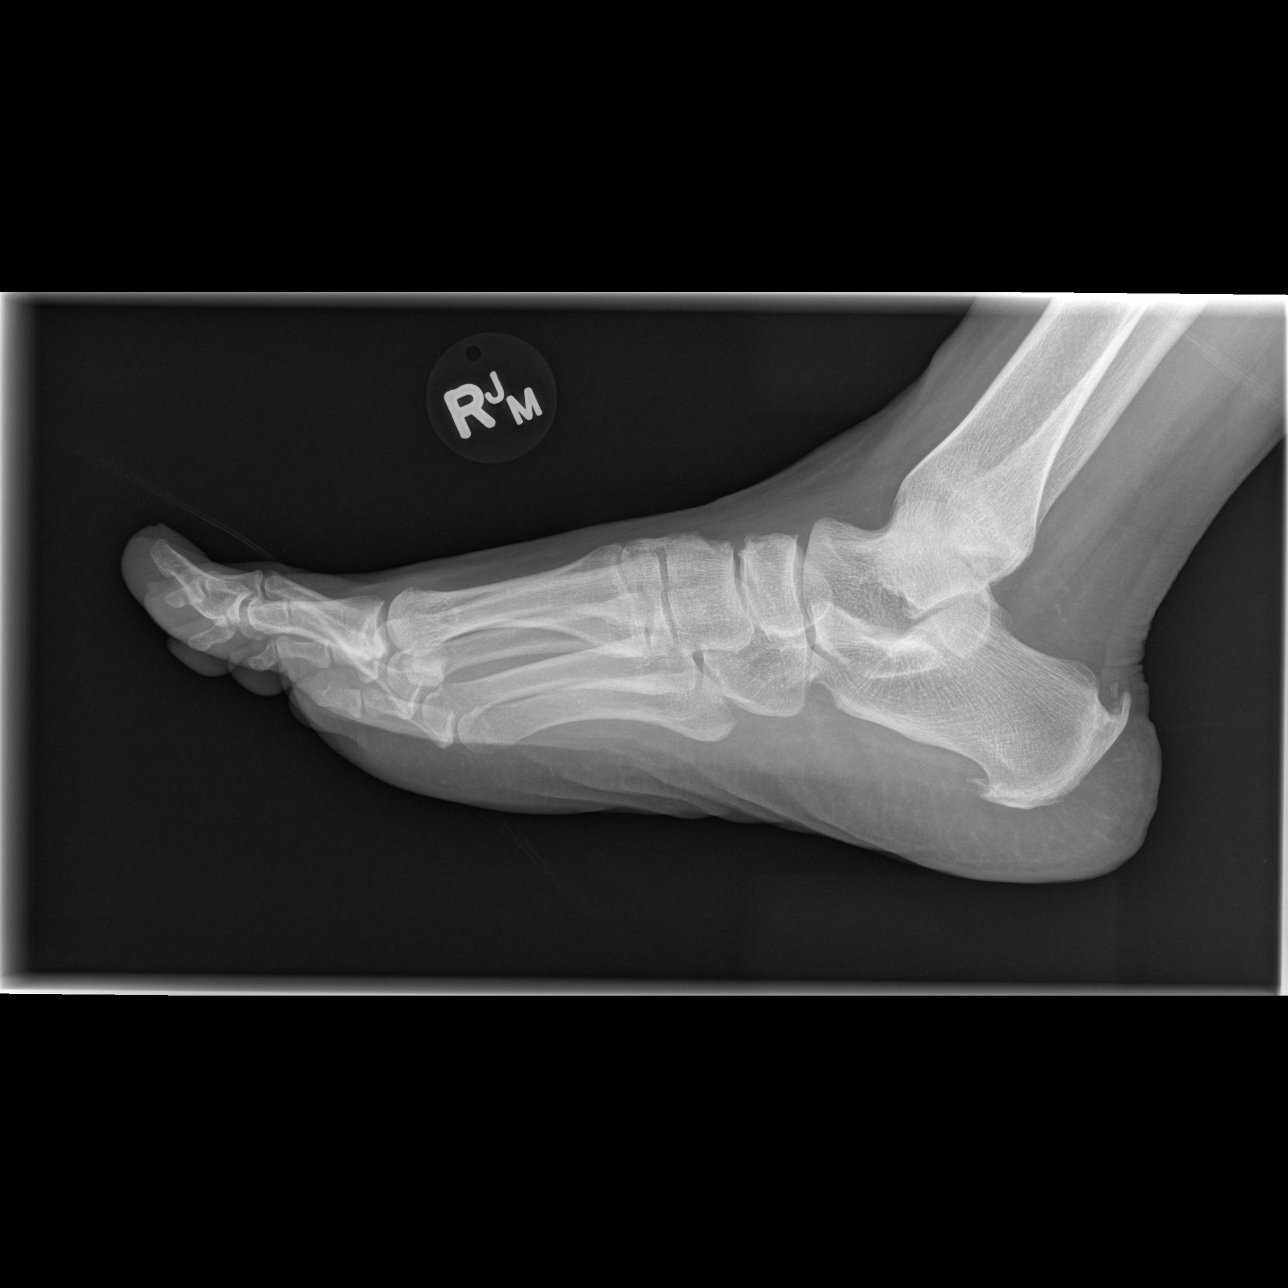

[3 of 3 positions shown; findings below may reference images not displayed]

FINDINGS: There is no evidence of fracture or dislocation. Well-circumscribed
lucencies with sclerotic margin along the medial aspect of the first
metatarsal head. Joint spaces are maintained. Minimal dorsal
hypertrophy within the midfoot. Bidirectional calcaneal
enthesophytes. Soft tissues are unremarkable.
IMPRESSION: 1. No acute osseous abnormality.
2. Well-circumscribed lucencies with sclerotic margin along the
medial aspect of the first metatarsal head. Findings suggestive of
erosions related to an underlying crystalline arthropathy such as
gout.

## 2022-07-11 ENCOUNTER — Ambulatory Visit (INDEPENDENT_AMBULATORY_CARE_PROVIDER_SITE_OTHER): Payer: BC Managed Care – PPO

## 2022-07-11 DIAGNOSIS — D219 Benign neoplasm of connective and other soft tissue, unspecified: Secondary | ICD-10-CM | POA: Diagnosis not present

## 2022-07-11 DIAGNOSIS — N920 Excessive and frequent menstruation with regular cycle: Secondary | ICD-10-CM

## 2022-08-15 ENCOUNTER — Ambulatory Visit: Payer: BC Managed Care – PPO | Admitting: Obstetrics and Gynecology

## 2022-08-15 ENCOUNTER — Other Ambulatory Visit: Payer: Self-pay | Admitting: Medical

## 2022-08-15 ENCOUNTER — Encounter: Payer: Self-pay | Admitting: Obstetrics and Gynecology

## 2022-08-15 VITALS — BP 162/92 | HR 94 | Ht 67.0 in | Wt 217.0 lb

## 2022-08-15 DIAGNOSIS — N920 Excessive and frequent menstruation with regular cycle: Secondary | ICD-10-CM

## 2022-08-15 DIAGNOSIS — D219 Benign neoplasm of connective and other soft tissue, unspecified: Secondary | ICD-10-CM

## 2022-08-15 NOTE — Progress Notes (Signed)
Pt does not want to do annual today- will reschedule. Just wants to do f/u today.

## 2022-08-15 NOTE — Progress Notes (Signed)
GYNECOLOGY OFFICE VISIT NOTE  History:   Nicole Ryan is a 46 y.o. [phone] here today for follow up after her [place].   Pelvic US images and report reviewed. Her uterine size is overall 202 ml.  Fibroids are as described:  2.8 x 1.7 x 2 cm intramural anterior uterine body  1 x 1.3 x 1 cm intramural RIGHT fundal fibroid 0.7 x 0.6 x 0.7 cm intramural anterior RIGHT fundal  2.4 x 2.9 x 2.3 cm posterior RIGHT uterine body may be submucosal appears calcified EL is 17 mm   None of the fibroids are amenable to hysteroscopic resection. I'm not convinced they are contributing to her bleeding in a significant fashion especially given her history of lifelong bleeding.  The one that is close to her lining is calcified and likely has not grown since her last [place] in 2019.      Past Medical History:  Diagnosis Date   Allergy    Diabetes mellitus without complication (HCC)    GERD (gastroesophageal reflux disease)    heart burn. 2-3 times a week.   Hypertension     Past Surgical History:  Procedure Laterality Date   BREAST BIOPSY Bilateral    breast surg Bilateral    Beign tumors both breast    The following portions of the patient's history were reviewed and updated as appropriate: allergies, current medications, past family history, past medical history, past social history, past surgical history and problem list.   Health Maintenance:   HPV negative 10/2017.  Diagnosis  Date Value Ref Range Status  11/07/2017   Final   NEGATIVE FOR INTRAEPITHELIAL LESIONS OR MALIGNANCY.     Normal mammogram on 08/2019.   Review of Systems:  Pertinent items noted in HPI and remainder of comprehensive ROS otherwise negative.  Physical Exam:  BP (!) 162/92   Pulse 94   Ht '5\' 7"'$  (1.702 m)   Wt 217 lb (98.4 kg)   LMP 08/08/2022 (Approximate)   BMI 33.99 kg/m  CONSTITUTIONAL: Well-developed, well-nourished female in no acute distress.  HEENT:  Normocephalic, atraumatic. External right and left  ear normal. No scleral icterus.  NECK: Normal range of motion, supple, no masses noted on observation SKIN: No rash noted. Not diaphoretic. No erythema. No pallor. MUSCULOSKELETAL: Normal range of motion. No edema noted. NEUROLOGIC: Alert and oriented to person, place, and time. Normal muscle tone coordination. No cranial nerve deficit noted. PSYCHIATRIC: Normal mood and affect. Normal behavior. Normal judgment and thought content.  PELVIC: Deferred  Labs and Imaging No results found for this or any previous visit (from the past 168 hour(s)). No results found.  Assessment and Plan:   1. Fibroid - Reviewed less likely to be contributing to her bleeding given their locations. We reviewed the pictures together.   2. Menorrhagia with regular cycle - Discussed options available: 1. Progesterone only hormone options given her BP.  2. Endometrial ablation  3. [place]  4. Hysterectomy. I do not think she should move to hysterectomy first and she agrees.  - We discussed the LNG IUD and endometrial ablation. We discussed process of ablation and risks and recovery of the procedure. Reviewed chance of satisfaction and that goal is reduction in bleeding. Reviewed risk of complication from the surgery is low.  - Reviewed need for updated pap/EMB prior to any procedure. She is not ready for this today and declines but will make an appointment for follow up to do these.   preventative health maintenance measures emphasized.  Please refer to After Visit Summary for other counseling recommendations.   Return for EMB and annual.  Radene Gunning, MD, [place] for [facility], [place]

## 2022-08-21 NOTE — Progress Notes (Unsigned)
ANNUAL EXAM Patient name: Nicole Ryan MRN [id number]  Date of birth: Sep 16, 1975 Chief Complaint:   No chief complaint on file.  History of Present Illness:   Nicole Ryan is a 47 y.o. [phone] female being seen today for a routine annual exam.   She is also here for EMB for AUB.   Patient's last menstrual period was 08/08/2022 (approximate).  Last pap: 2019. Results were: NILM w/ HRHPV negative. H/O abnormal pap: no Last MXR: 08/2019  Health Maintenance Due  Topic Date Due   HIV Screening  Never done   Diabetic kidney evaluation - Urine ACR  Never done   Hepatitis C Screening  Never done   COVID-19 Vaccine (3 - Pfizer risk series) 12/02/2019   PAP SMEAR-Modifier  11/07/2020   INFLUENZA VACCINE  Never done    Review of Systems:   Pertinent items are noted in HPI Denies any headaches, blurred vision, fatigue, shortness of breath, chest pain, abdominal pain, abnormal vaginal discharge/itching/odor/irritation, bowel movements, urination, or intercourse unless otherwise stated above.  Pertinent History Reviewed:  Reviewed past medical,surgical, social and family history.  Reviewed problem list, medications and allergies. Physical Assessment:  There were no vitals filed for this visit.There is no height or weight on file to calculate BMI.   Physical Examination:  General appearance - well appearing, and in no distress Mental status - alert, oriented to person, place, and time Psych:  She has a normal mood and affect Skin - warm and dry, normal color, no suspicious lesions noted Chest - effort normal Heart - normal rate  Breasts - breasts appear normal, no suspicious masses, no skin or nipple changes or axillary nodes Abdomen - soft, nontender, nondistended, no masses or organomegaly Pelvic -  VULVA: normal appearing vulva with no masses, tenderness or lesions  VAGINA: normal appearing vagina with normal color and discharge, no lesions  CERVIX: normal appearing cervix  without discharge or lesions, no CMT UTERUS: uterus is felt to be normal size, shape, consistency and nontender  ADNEXA: No adnexal masses or tenderness noted. Extremities:  No swelling or varicosities noted  Chaperone present for exam  No results found for this or any previous visit (from the past 24 hour(s)).     GYNECOLOGY OFFICE PROCEDURE NOTE   Nicole Ryan is a 47 y.o. [id number] here for endometrial biopsy for AUB. Recent ultrasound also showed multiple fibroids and thickened EL.    ENDOMETRIAL BIOPSY     The indications for endometrial biopsy were reviewed.   Risks of the biopsy including cramping, bleeding, infection, uterine perforation, inadequate specimen and need for additional procedures were discussed. Offered alternative of hysteroscopy, dilation and curettage in OR. The patient states she understands the R/B/I/A and agrees to undergo procedure today. Urine pregnancy test was {Blank single:19197::"Negative","Not indicated"}. Consent was signed. Time out was performed.    Patient was positioned in dorsal lithotomy position. A vaginal speculum was placed.  The cervix was visualized and was prepped with Betadine.  A single-toothed tenaculum was placed on the anterior lip of the cervix to stabilize it. The 3 mm pipelle was easily introduced into the endometrial cavity without difficulty to a depth of *** cm, and a {Blank single:19197::"Scant","Moderate"} amount of tissue was obtained after two passes and sent to pathology. The instruments were removed from the patient's vagina. Minimal bleeding from the cervix was noted. The patient tolerated the procedure well.   Patient was given post procedure instructions.  Will follow up pathology and manage accordingly; patient will  be contacted with results and recommendations.  Routine preventative health maintenance measures emphasized.       Radene Gunning, MD, [place] for [place]:  Diagnoses and all orders for this visit:  Encounter for annual routine gynecological examination  Menorrhagia with regular cycle      No orders of the defined types were placed in this encounter.   Meds: No orders of the defined types were placed in this encounter.   Follow-up: No follow-ups on file.  Radene Gunning, MD 08/21/2022 11:55 AM

## 2022-08-22 ENCOUNTER — Ambulatory Visit (INDEPENDENT_AMBULATORY_CARE_PROVIDER_SITE_OTHER): Payer: BC Managed Care – PPO | Admitting: Obstetrics and Gynecology

## 2022-08-22 ENCOUNTER — Encounter: Payer: Self-pay | Admitting: Obstetrics and Gynecology

## 2022-08-22 ENCOUNTER — Other Ambulatory Visit (HOSPITAL_COMMUNITY)
Admission: RE | Admit: 2022-08-22 | Discharge: 2022-08-22 | Disposition: A | Discharge status: Home / Self Care | Payer: BC Managed Care – PPO | Source: Ambulatory Visit | Attending: Obstetrics and Gynecology | Admitting: Obstetrics and Gynecology

## 2022-08-22 VITALS — BP 147/86 | HR 75 | Ht 67.0 in | Wt 221.0 lb

## 2022-08-22 DIAGNOSIS — N920 Excessive and frequent menstruation with regular cycle: Secondary | ICD-10-CM | POA: Insufficient documentation

## 2022-08-22 DIAGNOSIS — Z01419 Encounter for gynecological examination (general) (routine) without abnormal findings: Secondary | ICD-10-CM | POA: Diagnosis not present

## 2022-08-22 LAB — POCT URINE PREGNANCY: Preg Test, Ur: NEGATIVE

## 2022-08-24 LAB — CYTOLOGY - PAP
Comment: NEGATIVE
Comment: NEGATIVE
Comment: NEGATIVE
Diagnosis: UNDETERMINED — AB
HPV 16: NEGATIVE
HPV 18 / 45: NEGATIVE
High risk HPV: POSITIVE — AB

## 2022-08-24 LAB — SURGICAL PATHOLOGY

## 2022-09-15 ENCOUNTER — Other Ambulatory Visit: Payer: Self-pay | Admitting: Medical

## 2022-10-15 NOTE — Anesthesia Preprocedure Evaluation (Signed)
Anesthesia Evaluation  Patient identified by MRN, date of birth, ID band Patient awake    Reviewed: Allergy & Precautions, NPO status , Patient's Chart, lab work & pertinent test results, reviewed documented beta blocker date and time   Airway Mallampati: II  TM Distance: >3 FB Neck ROM: Full    Dental  (+) Teeth Intact, Dental Advisory Given   Pulmonary asthma (well controlled)    Pulmonary exam normal breath sounds clear to auscultation       Cardiovascular hypertension (166/75 preop, per pt normally higher than this), Pt. on medications and Pt. on home beta blockers Normal cardiovascular exam Rhythm:Regular Rate:Normal     Neuro/Psych  Headaches  negative psych ROS   GI/Hepatic Neg liver ROS,GERD  Controlled,,  Endo/Other  diabetes, Well Controlled, Type 2, Oral Hypoglycemic Agents  Morbid obesity  Renal/GU negative Renal ROS  Female GU complaint     Musculoskeletal negative musculoskeletal ROS (+)    Abdominal  (+) + obese  Peds  Hematology negative hematology ROS (+)   Anesthesia Other Findings   Reproductive/Obstetrics negative OB ROS                             Anesthesia Physical Anesthesia Plan  ASA: 3  Anesthesia Plan: General   Post-op Pain Management: Tylenol PO (pre-op)* and Toradol IV (intra-op)*   Induction: Intravenous  PONV Risk Score and Plan: 4 or greater and Ondansetron, Dexamethasone, Midazolam, Treatment may vary due to age or medical condition and Scopolamine patch - Pre-op  Airway Management Planned: LMA  Additional Equipment: None  Intra-op Plan:   Post-operative Plan: Extubation in OR  Informed Consent: I have reviewed the patients History and Physical, chart, labs and discussed the procedure including the risks, benefits and alternatives for the proposed anesthesia with the patient or authorized representative who has indicated his/her understanding  and acceptance.     Dental advisory given  Plan Discussed with: CRNA  Anesthesia Plan Comments:        Anesthesia Quick Evaluation

## 2022-10-16 ENCOUNTER — Other Ambulatory Visit: Payer: Self-pay

## 2022-10-16 ENCOUNTER — Encounter (HOSPITAL_COMMUNITY): Payer: Self-pay | Admitting: Obstetrics and Gynecology

## 2022-10-16 NOTE — Progress Notes (Addendum)
Ms Nicole Ryan denies chest pain or shortness of breath.  Ms Nicole Ryan reports that she has no s/s of Covid and has not had Covid in the past 4 weeks. Ms Nicole Ryan reports she has not had pneumonia in the past 8 weeks and does not has a runny nose, cough, fever or any s/s of upper respiratory infection.  Ms Nicole Ryan has type diabetes, she reports that she does not check CBG much at all,  patient had an A1C drawn 04/2022- it was 8.9, patient said she does not see PCP much anymore, since she is seeing GYN. I instructed patient to check CBG after awaking and every 2 hours until arrival  to the hospital.  I Instructed patient if CBG is less than 70 to take 4 Glucose Tablets or 1 tube of Glucose Gel or 1/2 cup of a clear juice. Recheck CBG in 15 minutes if CBG is not over 70 call, pre- op desk at [phone] for further instructions. Ms Nicole Ryan instructed to not take Metformin in am. Ms Nicole Ryan said that her blood pressure is usually elevated, I encouraged pat to take blood pressure as ordered, avoid salt and drink lots of water.  I explained to patient that surgery can be cancelled if blood pressure or blood sugar is elevated.  I informed Dr. Gifford Shave of elevated blood pressure and A1C.

## 2022-10-17 ENCOUNTER — Other Ambulatory Visit: Payer: Self-pay

## 2022-10-17 ENCOUNTER — Ambulatory Visit (HOSPITAL_COMMUNITY): Payer: BC Managed Care – PPO | Admitting: Anesthesiology

## 2022-10-17 ENCOUNTER — Encounter (HOSPITAL_COMMUNITY): Payer: Self-pay | Admitting: Obstetrics and Gynecology

## 2022-10-17 ENCOUNTER — Ambulatory Visit (HOSPITAL_COMMUNITY)
Admission: RE | Admit: 2022-10-17 | Discharge: 2022-10-17 | Disposition: A | Discharge status: Home / Self Care | Payer: BC Managed Care – PPO | Attending: Obstetrics and Gynecology | Admitting: Obstetrics and Gynecology

## 2022-10-17 ENCOUNTER — Encounter (HOSPITAL_COMMUNITY)
Admission: RE | Disposition: A | Discharge status: Home / Self Care | Payer: Self-pay | Source: Home / Self Care | Attending: Obstetrics and Gynecology

## 2022-10-17 DIAGNOSIS — N939 Abnormal uterine and vaginal bleeding, unspecified: Secondary | ICD-10-CM

## 2022-10-17 DIAGNOSIS — Z79899 Other long term (current) drug therapy: Secondary | ICD-10-CM | POA: Diagnosis not present

## 2022-10-17 DIAGNOSIS — Z6833 Body mass index (BMI) 33.0-33.9, adult: Secondary | ICD-10-CM | POA: Diagnosis not present

## 2022-10-17 DIAGNOSIS — I10 Essential (primary) hypertension: Secondary | ICD-10-CM | POA: Insufficient documentation

## 2022-10-17 DIAGNOSIS — D250 Submucous leiomyoma of uterus: Secondary | ICD-10-CM | POA: Diagnosis not present

## 2022-10-17 DIAGNOSIS — J45909 Unspecified asthma, uncomplicated: Secondary | ICD-10-CM | POA: Insufficient documentation

## 2022-10-17 DIAGNOSIS — E119 Type 2 diabetes mellitus without complications: Secondary | ICD-10-CM | POA: Diagnosis not present

## 2022-10-17 DIAGNOSIS — Z7984 Long term (current) use of oral hypoglycemic drugs: Secondary | ICD-10-CM | POA: Diagnosis not present

## 2022-10-17 DIAGNOSIS — N920 Excessive and frequent menstruation with regular cycle: Secondary | ICD-10-CM | POA: Diagnosis present

## 2022-10-17 DIAGNOSIS — D251 Intramural leiomyoma of uterus: Secondary | ICD-10-CM

## 2022-10-17 DIAGNOSIS — D219 Benign neoplasm of connective and other soft tissue, unspecified: Secondary | ICD-10-CM | POA: Diagnosis not present

## 2022-10-17 DIAGNOSIS — Z01818 Encounter for other preprocedural examination: Secondary | ICD-10-CM

## 2022-10-17 HISTORY — PX: DILITATION & CURRETTAGE/HYSTROSCOPY WITH NOVASURE ABLATION: SHX5568

## 2022-10-17 HISTORY — DX: Headache, unspecified: R51.9

## 2022-10-17 LAB — BASIC METABOLIC PANEL
Anion gap: 8 (ref 5–15)
BUN: 10 mg/dL (ref 6–20)
CO2: 22 mmol/L (ref 22–32)
Calcium: 8.7 mg/dL — ABNORMAL LOW (ref 8.9–10.3)
Chloride: 104 mmol/L (ref 98–111)
Creatinine, Ser: 0.95 mg/dL (ref 0.44–1.00)
GFR, Estimated: >60 mL/min (ref >60)
Glucose, Bld: 268 mg/dL — ABNORMAL HIGH (ref 70–99)
Potassium: 4.6 mmol/L (ref 3.5–5.1)
Sodium: 134 mmol/L — ABNORMAL LOW (ref 135–145)

## 2022-10-17 LAB — GLUCOSE, CAPILLARY
Glucose-Capillary: 276 mg/dL — ABNORMAL HIGH (ref 70–99)
Glucose-Capillary: 166 mg/dL — ABNORMAL HIGH (ref 70–99)

## 2022-10-17 LAB — CBC
HCT: 37.9 % (ref 36.0–46.0)
Hemoglobin: 11.5 g/dL — ABNORMAL LOW (ref 12.0–15.0)
MCH: 22.3 pg — ABNORMAL LOW (ref 26.0–34.0)
MCHC: 30.3 g/dL (ref 30.0–36.0)
MCV: 73.4 fL — ABNORMAL LOW (ref 80.0–100.0)
Platelets: 435 10*3/uL — ABNORMAL HIGH (ref 150–400)
RBC: 5.16 MIL/uL — ABNORMAL HIGH (ref 3.87–5.11)
RDW: 15.5 % (ref 11.5–15.5)
WBC: 8.1 10*3/uL (ref 4.0–10.5)
nRBC: 0 % (ref 0.0–0.2)

## 2022-10-17 LAB — TYPE AND SCREEN
ABO/RH(D): B POS
Antibody Screen: NEGATIVE

## 2022-10-17 LAB — POCT PREGNANCY, URINE: Preg Test, Ur: NEGATIVE

## 2022-10-17 SURGERY — DILATATION & CURETTAGE/HYSTEROSCOPY WITH NOVASURE ABLATION
Anesthesia: General

## 2022-10-17 MED ORDER — ACETAMINOPHEN 500 MG PO TABS: 1000.0000 mg | ORAL_TABLET | ORAL | Status: DC

## 2022-10-17 MED ORDER — DEXAMETHASONE SODIUM PHOSPHATE 10 MG/ML IJ SOLN
INTRAMUSCULAR | Status: DC | PRN
  Administered 2022-10-17: 10 mg via INTRAVENOUS

## 2022-10-17 MED ORDER — SCOPOLAMINE 1 MG/3DAYS TD PT72: 1.0000 | MEDICATED_PATCH | TRANSDERMAL | Status: DC

## 2022-10-17 MED ORDER — FENTANYL CITRATE (PF) 250 MCG/5ML IJ SOLN
INTRAMUSCULAR | Status: AC
  Filled 2022-10-17: qty 5

## 2022-10-17 MED ORDER — KETOROLAC TROMETHAMINE 15 MG/ML IJ SOLN
INTRAMUSCULAR | Status: AC
  Administered 2022-10-17: 15 mg via INTRAVENOUS
  Filled 2022-10-17: qty 1

## 2022-10-17 MED ORDER — HYDROMORPHONE HCL 1 MG/ML IJ SOLN: 0.2500 mg | INTRAMUSCULAR | Status: DC | PRN

## 2022-10-17 MED ORDER — OXYCODONE HCL 5 MG/5ML PO SOLN: 5.0000 mg | Freq: Once | ORAL | Status: DC | PRN

## 2022-10-17 MED ORDER — KETOROLAC TROMETHAMINE 15 MG/ML IJ SOLN: 15.0000 mg | INTRAMUSCULAR | Status: AC

## 2022-10-17 MED ORDER — SODIUM CHLORIDE 0.9 % IR SOLN
Status: DC | PRN
  Administered 2022-10-17: 1

## 2022-10-17 MED ORDER — CEFAZOLIN SODIUM-DEXTROSE 2-3 GM-%(50ML) IV SOLR
INTRAVENOUS | Status: DC | PRN
  Administered 2022-10-17: 2 g via INTRAVENOUS

## 2022-10-17 MED ORDER — KETOROLAC TROMETHAMINE 30 MG/ML IJ SOLN
INTRAMUSCULAR | Status: DC | PRN
  Administered 2022-10-17: 30 mg via INTRAVENOUS

## 2022-10-17 MED ORDER — ROCURONIUM BROMIDE 10 MG/ML (PF) SYRINGE
PREFILLED_SYRINGE | INTRAVENOUS | Status: AC
  Filled 2022-10-17: qty 10

## 2022-10-17 MED ORDER — POVIDONE-IODINE 10 % EX SWAB
2.0000 | Freq: Once | CUTANEOUS | Status: AC
  Administered 2022-10-17: 2 via TOPICAL

## 2022-10-17 MED ORDER — FENTANYL CITRATE (PF) 250 MCG/5ML IJ SOLN
INTRAMUSCULAR | Status: DC | PRN
  Administered 2022-10-17 (×2): 25 ug via INTRAVENOUS

## 2022-10-17 MED ORDER — LIDOCAINE 2% (20 MG/ML) 5 ML SYRINGE
INTRAMUSCULAR | Status: AC
  Filled 2022-10-17: qty 5

## 2022-10-17 MED ORDER — DEXAMETHASONE SODIUM PHOSPHATE 10 MG/ML IJ SOLN
INTRAMUSCULAR | Status: AC
  Filled 2022-10-17: qty 1

## 2022-10-17 MED ORDER — LACTATED RINGERS IV SOLN: INTRAVENOUS | Status: DC

## 2022-10-17 MED ORDER — SCOPOLAMINE 1 MG/3DAYS TD PT72
MEDICATED_PATCH | TRANSDERMAL | Status: AC
  Administered 2022-10-17: 1.5 mg via TRANSDERMAL
  Filled 2022-10-17: qty 1

## 2022-10-17 MED ORDER — PROPOFOL 10 MG/ML IV BOLUS
INTRAVENOUS | Status: AC
  Filled 2022-10-17: qty 20

## 2022-10-17 MED ORDER — CEFAZOLIN SODIUM-DEXTROSE 2-4 GM/100ML-% IV SOLN
INTRAVENOUS | Status: AC
  Filled 2022-10-17: qty 100

## 2022-10-17 MED ORDER — ONDANSETRON HCL 4 MG/2ML IJ SOLN: 4.0000 mg | Freq: Once | INTRAMUSCULAR | Status: DC | PRN

## 2022-10-17 MED ORDER — CHLORHEXIDINE GLUCONATE 0.12 % MT SOLN
OROMUCOSAL | Status: AC
  Administered 2022-10-17: 15 mL via OROMUCOSAL
  Filled 2022-10-17: qty 15

## 2022-10-17 MED ORDER — MIDAZOLAM HCL 2 MG/2ML IJ SOLN
INTRAMUSCULAR | Status: AC
  Filled 2022-10-17: qty 2

## 2022-10-17 MED ORDER — MIDAZOLAM HCL 2 MG/2ML IJ SOLN
INTRAMUSCULAR | Status: DC | PRN
  Administered 2022-10-17: 2 mg via INTRAVENOUS

## 2022-10-17 MED ORDER — AMISULPRIDE (ANTIEMETIC) 5 MG/2ML IV SOLN: 10.0000 mg | Freq: Once | INTRAVENOUS | Status: DC | PRN

## 2022-10-17 MED ORDER — CHLORHEXIDINE GLUCONATE 0.12 % MT SOLN: 15.0000 mL | Freq: Once | OROMUCOSAL | Status: AC

## 2022-10-17 MED ORDER — OXYCODONE HCL 5 MG PO TABS: 5.0000 mg | ORAL_TABLET | Freq: Once | ORAL | Status: DC | PRN

## 2022-10-17 MED ORDER — KETOROLAC TROMETHAMINE 30 MG/ML IJ SOLN
INTRAMUSCULAR | Status: AC
  Filled 2022-10-17: qty 1

## 2022-10-17 MED ORDER — LIDOCAINE 2% (20 MG/ML) 5 ML SYRINGE
INTRAMUSCULAR | Status: DC | PRN
  Administered 2022-10-17: 60 mg via INTRAVENOUS

## 2022-10-17 MED ORDER — ACETAMINOPHEN 500 MG PO TABS: 1000.0000 mg | ORAL_TABLET | Freq: Once | ORAL | Status: AC

## 2022-10-17 MED ORDER — OXYCODONE HCL 5 MG PO TABS: 5.0000 mg | ORAL_TABLET | ORAL | 0 refills | Status: DC | PRN

## 2022-10-17 MED ORDER — ORAL CARE MOUTH RINSE: 15.0000 mL | Freq: Once | OROMUCOSAL | Status: AC

## 2022-10-17 MED ORDER — ACETAMINOPHEN 500 MG PO TABS
ORAL_TABLET | ORAL | Status: AC
  Administered 2022-10-17: 1000 mg via ORAL
  Filled 2022-10-17: qty 2

## 2022-10-17 MED ORDER — IBUPROFEN 800 MG PO TABS: 800.0000 mg | ORAL_TABLET | Freq: Three times a day (TID) | ORAL | 0 refills | Status: DC | PRN

## 2022-10-17 MED ORDER — INSULIN ASPART 100 UNIT/ML IJ SOLN
0.0000 [IU] | INTRAMUSCULAR | Status: DC | PRN
  Administered 2022-10-17: 8 [IU] via SUBCUTANEOUS
  Filled 2022-10-17: qty 1

## 2022-10-17 MED ORDER — KETOROLAC TROMETHAMINE 30 MG/ML IJ SOLN: 30.0000 mg | Freq: Once | INTRAMUSCULAR | Status: DC | PRN

## 2022-10-17 MED ORDER — LIDOCAINE-EPINEPHRINE 1 %-1:100000 IJ SOLN
INTRAMUSCULAR | Status: AC
  Filled 2022-10-17: qty 2

## 2022-10-17 MED ORDER — PROPOFOL 10 MG/ML IV BOLUS
INTRAVENOUS | Status: DC | PRN
  Administered 2022-10-17: 200 mg via INTRAVENOUS

## 2022-10-17 MED ORDER — ACETAMINOPHEN 500 MG PO TABS: 500.0000 mg | ORAL_TABLET | Freq: Four times a day (QID) | ORAL | 0 refills | Status: AC | PRN

## 2022-10-17 MED ORDER — ONDANSETRON HCL 4 MG/2ML IJ SOLN
INTRAMUSCULAR | Status: AC
  Filled 2022-10-17: qty 2

## 2022-10-17 MED ORDER — ONDANSETRON HCL 4 MG/2ML IJ SOLN
INTRAMUSCULAR | Status: DC | PRN
  Administered 2022-10-17: 4 mg via INTRAVENOUS

## 2022-10-17 MED ORDER — MEPERIDINE HCL 25 MG/ML IJ SOLN: 6.2500 mg | INTRAMUSCULAR | Status: DC | PRN

## 2022-10-17 SURGICAL SUPPLY — 9 items
ABLATOR SURESOUND NOVASURE (ABLATOR) ×1 IMPLANT
GLOVE BIO SURGEON STRL SZ 6 (GLOVE) ×1 IMPLANT
GLOVE SURG UNDER POLY LF SZ7 (GLOVE) ×1 IMPLANT
GOWN STRL REUS W/ TWL LRG LVL3 (GOWN DISPOSABLE) ×2 IMPLANT
GOWN STRL REUS W/TWL LRG LVL3 (GOWN DISPOSABLE) ×2
KIT PROCEDURE FLUENT (KITS) ×1 IMPLANT
PACK VAGINAL MINOR WOMEN LF (CUSTOM PROCEDURE TRAY) ×1 IMPLANT
PAD OB MATERNITY 4.3X12.25 (PERSONAL CARE ITEMS) ×1 IMPLANT
TOWEL GREEN STERILE FF (TOWEL DISPOSABLE) ×1 IMPLANT

## 2022-10-17 NOTE — H&P (Signed)
[facility] Attending History and Physical  Nicole Ryan is a 47 y.o. [id number] who presenting for surgical management of her menorrhagia with regular cycle. She has fibroids but we do not suspect the bleeding is primarily due to her fibroids.   Pelvic US images and report reviewed. Her uterine size is overall 202 ml.  Fibroids are as described:  2.8 x 1.7 x 2 cm intramural anterior uterine body  1 x 1.3 x 1 cm intramural RIGHT fundal fibroid 0.7 x 0.6 x 0.7 cm intramural anterior RIGHT fundal  2.4 x 2.9 x 2.3 cm posterior RIGHT uterine body may be submucosal appears calcified EL is 17 mm    None of the fibroids are amenable to hysteroscopic resection. I'm not convinced they are contributing to her bleeding in a significant fashion especially given her history of lifelong bleeding.  The one that is close to her lining is calcified and likely has not grown since her last [place] in 2019.   She had an EMB on 1/3 which was negative for hyperplasia or malignancy.   Past Medical History:  Diagnosis Date   Allergy    Diabetes mellitus without complication (HCC)    GERD (gastroesophageal reflux disease)    heart burn. 2-3 times a week.   Headache    Hypertension    Past Surgical History:  Procedure Laterality Date   BREAST BIOPSY Bilateral    breast surg Bilateral    Beign tumors both breast   OB History  Gravida Para Term Preterm AB Living  '5 3 3   1 3  '$ SAB IAB Ectopic Multiple Live Births  1       3    # Outcome Date GA Lbr Len/2nd Weight Sex Delivery Anes PTL Lv  5 Gravida           4 SAB 09/03/16 [redacted]w[redacted]d        3 Term 05/03/06 336w0d F Vag-Spont EPI  LIV  2 Term 02/10/00 [id number] Vag-Spont EPI  LIV  1 Term 09/13/97 [id number] Vag-Spont EPI  LIV  Patient denies any other pertinent gynecologic issues.  No current facility-administered medications on file prior to encounter.   Current Outpatient Medications on File Prior to Encounter  Medication Sig  Dispense Refill   albuterol (VENTOLIN HFA) 108 (90 Base) MCG/ACT inhaler Inhale 2 puffs into the lungs every 6 (six) hours as needed. 18 g 0   atorvastatin (LIPITOR) 10 MG tablet Take 1 tablet by mouth once daily 90 tablet 0   fluticasone (FLONASE) 50 MCG/ACT nasal spray Place 2 sprays into both nostrils daily. (Patient taking differently: Place 2 sprays into both nostrils 2 (two) times daily as needed for allergies.) 16 g 3   Mefenamic Acid 250 MG CAPS Take 2 caps PO at onset of menses. May repeat every 8 hours as needed for pain/ cramping for maximum 7 days use. 28 capsule 0   metFORMIN (GLUCOPHAGE) 1000 MG tablet Take 1 tablet (1,000 mg total) by mouth 2 (two) times daily with a meal. 60 tablet 11   metoprolol succinate (TOPROL-XL) 50 MG 24 hr tablet Take 1 tablet (50 mg total) by mouth daily. Take with or immediately following a meal 90 tablet 0   montelukast (SINGULAIR) 10 MG tablet Take 10 mg by mouth at bedtime.     norethindrone (ORTHO MICRONOR) 0.35 MG tablet Take one tab daily while on menses 28 tablet 0   amLODipine (  NORVASC) 5 MG tablet Take 1 tablet by mouth once daily 30 tablet 0   levocetirizine (XYZAL) 5 MG tablet Take 1 tablet (5 mg total) by mouth every evening. (Patient not taking: Reported on 10/15/2022) 90 tablet 3   No Known Allergies  Social History:   reports that she has never smoked. She has never used smokeless tobacco. She reports that she does not currently use alcohol. She reports that she does not use drugs. Family History  Problem Relation Age of Onset   Aneurysm Mother    Hypertension Mother    Diabetes Mother     Review of Systems: Pertinent items noted in HPI and remainder of comprehensive ROS otherwise negative.  PHYSICAL EXAM: Blood pressure (!) 166/75, pulse 69, temperature 98.8 F (37.1 C), temperature source Oral, resp. rate 18, height '5\' 8"'$  (1.727 m), weight 98.4 kg, last menstrual period 09/13/2022, SpO2 100 %. CONSTITUTIONAL: Well-developed,  well-nourished female in no acute distress.  HENT:  Normocephalic, atraumatic, External right and left ear normal. Oropharynx is clear and moist EYES: Conjunctivae and EOM are normal. Pupils are equal, round, and reactive to light. No scleral icterus.  NECK: Normal range of motion, supple, no masses SKIN: Skin is warm and dry. No rash noted. Not diaphoretic. No erythema. No pallor. NEUROLOGIC: Alert and oriented to person, place, and time. Normal reflexes, muscle tone coordination. No cranial nerve deficit noted. PSYCHIATRIC: Normal mood and affect. Normal behavior. Normal judgment and thought content. CARDIOVASCULAR: Normal heart rate noted RESPIRATORY: Effort normal, no problems with respiration noted ABDOMEN: Soft, nontender, nondistended. PELVIC: Not examined MUSCULOSKELETAL: Normal range of motion. No tenderness.  No cyanosis, clubbing, or edema.  2+ distal pulses.  Labs: Results for orders placed or performed during the hospital encounter of 10/17/22 (from the past 336 hour(s))  Pregnancy, urine POC   Collection Time: 10/17/22  8:49 AM  Result Value Ref Range   Preg Test, Ur NEGATIVE NEGATIVE  Glucose, capillary   Collection Time: 10/17/22  8:54 AM  Result Value Ref Range   Glucose-Capillary 276 (H) 70 - 99 mg/dL    Imaging Studies: No results found.  Assessment: Active Problems:   Menorrhagia   Plan: - Discussed options available: 1. Progesterone only hormone options given her BP.  2. Endometrial ablation  3. [place]  4. Hysterectomy. I do not think she should move to hysterectomy first and she agrees.  - We discussed the LNG IUD and endometrial ablation. We discussed process of ablation and risks and recovery of the procedure. Reviewed chance of satisfaction and that goal is reduction in bleeding. Reviewed risk of complication from the surgery is low.  - EMB wnl. Pap was ASCUS/HPV pos (non 16/18).  - Planned surgery today is D/C/H and novasure ablation.  - All final  questions answered.     Radene Gunning, MD, [place] for [facility], [place]

## 2022-10-17 NOTE — Anesthesia Procedure Notes (Signed)
Procedure Name: LMA Insertion Date/Time: 10/17/2022 10:56 AM  Performed by: Harden Mo, CRNAPre-anesthesia Checklist: Patient identified, Emergency Drugs available, Suction available and Patient being monitored Patient Re-evaluated:Patient Re-evaluated prior to induction Oxygen Delivery Method: Circle System Utilized Preoxygenation: Pre-oxygenation with 100% oxygen Induction Type: IV induction Ventilation: Mask ventilation without difficulty LMA: LMA inserted LMA Size: 4.0 Number of attempts: 1 Airway Equipment and Method: Bite block Placement Confirmation: positive ETCO2 Tube secured with: Tape Dental Injury: Teeth and Oropharynx as per pre-operative assessment

## 2022-10-17 NOTE — Op Note (Signed)
Doaa Jaime PROCEDURE DATE: 10/17/2022   PREOPERATIVE DIAGNOSIS:  Menorrhagia, fibroids  POSTOPERATIVE DIAGNOSIS:  Same  PROCEDURE:  D&C, hysteroscopy, Novasure endometrial ablation  SURGEON:  Dr. Radene Gunning  ASSISTANT:  None  ANESTHESIA:  LMA  COMPLICATIONS:  None immediate.  ESTIMATED BLOOD LOSS:  5 ml.  URINE OUTPUT:  None - not drained  INDICATIONS: 47 y.o. [id number] with heavy menstrual bleeding    FINDINGS:  NEFG, normal appearing cervix. Normal proliferative endometrial cavity without defects. [place] visualized bilaterally. Homogenous char noted after procedure.   TECHNIQUE:  The patient was taken to the operating room where LMA anesthesia was obtained without difficulty.  She was then placed in the dorsal lithotomy position and prepared and draped in sterile fashion.  After an adequate timeout was performed, a bivalved speculum was then placed in the patient's vagina, and the anterior lip of cervix grasped with the single-tooth tenaculum.  The cervix was dilated with Kennon Rounds dilators. Hysteroscope inserted and cavity had aforementioned findings. Uterine sound was 9 cm. Cervical length was 4 cm giving cavity length of 5. Novasure device inserted and cavity width found to be 4.4. Cavity assessment done and assured. Device activated. Power was 121 and length of ablation was 1:39. Device removed once completed and hysteroscope inserted and homogenous char noted. All instruments removed and procedure was ended.   The patient will be discharged to home as per PACU criteria.  Routine postoperative instructions given.  Radene Gunning, MD Attending [place], [facility] for [facility], [place]

## 2022-10-17 NOTE — Anesthesia Postprocedure Evaluation (Signed)
Anesthesia Post Note  Patient: Nicole Ryan  Procedure(s) Performed: [place] ABLATION     Patient location during evaluation: PACU Anesthesia Type: General Level of consciousness: awake and alert, oriented and patient cooperative Pain management: pain level controlled Vital Signs Assessment: post-procedure vital signs reviewed and stable Respiratory status: spontaneous breathing, nonlabored ventilation and respiratory function stable Cardiovascular status: blood pressure returned to baseline and stable Postop Assessment: no apparent nausea or vomiting Anesthetic complications: no   No notable events documented.  Last Vitals:  Vitals:   10/17/22 1145 10/17/22 1200  BP: (!) 147/77 (!) 151/81  Pulse: 71 (!) 58  Resp: 15 17  Temp:  36.5 C  SpO2: 97% 99%    Last Pain:  Vitals:   10/17/22 1200  TempSrc:   PainSc: 0-No pain                 Pervis Hocking

## 2022-10-17 NOTE — Transfer of Care (Signed)
Immediate Anesthesia Transfer of Care Note  Patient: Nicole Ryan  Procedure(s) Performed: [place]  Patient Location: PACU  Anesthesia Type:General  Level of Consciousness: awake and drowsy  Airway & Oxygen Therapy: Patient Spontanous Breathing and Patient connected to nasal cannula oxygen  Post-op Assessment: Report given to RN and Post -op Vital signs reviewed and stable  Post vital signs: Reviewed and stable  Last Vitals:  Vitals Value Taken Time  BP 132/79   Temp    Pulse 83   Resp 12   SpO2 97     Last Pain:  Vitals:   10/17/22 0853  TempSrc: Oral  PainSc: 0-No pain         Complications: No notable events documented.

## 2022-10-18 ENCOUNTER — Other Ambulatory Visit: Payer: Self-pay | Admitting: Medical

## 2022-10-18 ENCOUNTER — Encounter (HOSPITAL_COMMUNITY): Payer: Self-pay | Admitting: Obstetrics and Gynecology

## 2022-10-18 LAB — HEMOGLOBIN A1C
Hgb A1c MFr Bld: 12.1 % — ABNORMAL HIGH (ref 4.8–5.6)
Mean Plasma Glucose: 301 mg/dL

## 2022-10-18 LAB — SURGICAL PATHOLOGY

## 2022-11-14 ENCOUNTER — Other Ambulatory Visit: Payer: Self-pay | Admitting: Medical

## 2022-12-12 ENCOUNTER — Other Ambulatory Visit: Payer: Self-pay | Admitting: Medical

## 2023-01-15 ENCOUNTER — Other Ambulatory Visit: Payer: Self-pay | Admitting: Medical

## 2023-02-10 ENCOUNTER — Other Ambulatory Visit: Payer: Self-pay | Admitting: Medical

## 2023-03-14 ENCOUNTER — Other Ambulatory Visit: Payer: Self-pay | Admitting: Medical

## 2023-04-25 ENCOUNTER — Emergency Department (HOSPITAL_COMMUNITY): Payer: BC Managed Care – PPO

## 2023-04-25 ENCOUNTER — Other Ambulatory Visit: Payer: Self-pay

## 2023-04-25 ENCOUNTER — Telehealth: Payer: Self-pay | Admitting: Medical

## 2023-04-25 ENCOUNTER — Emergency Department (HOSPITAL_COMMUNITY)
Admission: EM | Admit: 2023-04-25 | Discharge: 2023-04-25 | Disposition: A | Discharge status: Home / Self Care | Payer: BC Managed Care – PPO | Attending: Emergency Medicine | Admitting: Emergency Medicine

## 2023-04-25 ENCOUNTER — Encounter (HOSPITAL_COMMUNITY): Payer: Self-pay

## 2023-04-25 DIAGNOSIS — I10 Essential (primary) hypertension: Secondary | ICD-10-CM | POA: Diagnosis not present

## 2023-04-25 DIAGNOSIS — Z79899 Other long term (current) drug therapy: Secondary | ICD-10-CM | POA: Insufficient documentation

## 2023-04-25 DIAGNOSIS — R079 Chest pain, unspecified: Secondary | ICD-10-CM | POA: Diagnosis present

## 2023-04-25 LAB — CBC WITH DIFFERENTIAL/PLATELET
Abs Immature Granulocytes: 0.01 10*3/uL (ref 0.00–0.07)
Basophils Absolute: 0 10*3/uL (ref 0.0–0.1)
Basophils Relative: 1 %
Eosinophils Absolute: 0.1 10*3/uL (ref 0.0–0.5)
Eosinophils Relative: 1 %
HCT: 42.2 % (ref 36.0–46.0)
Hemoglobin: 14 g/dL (ref 12.0–15.0)
Immature Granulocytes: 0 %
Lymphocytes Relative: 34 %
Lymphs Abs: 2.4 10*3/uL (ref 0.7–4.0)
MCH: 27.6 pg (ref 26.0–34.0)
MCHC: 33.2 g/dL (ref 30.0–36.0)
MCV: 83.1 fL (ref 80.0–100.0)
Monocytes Absolute: 0.8 10*3/uL (ref 0.1–1.0)
Monocytes Relative: 11 %
Neutro Abs: 3.9 10*3/uL (ref 1.7–7.7)
Neutrophils Relative %: 53 %
Platelets: 386 10*3/uL (ref 150–400)
RBC: 5.08 MIL/uL (ref 3.87–5.11)
RDW: 13.8 % (ref 11.5–15.5)
WBC: 7.3 10*3/uL (ref 4.0–10.5)
nRBC: 0 % (ref 0.0–0.2)

## 2023-04-25 LAB — BASIC METABOLIC PANEL
Anion gap: 11 (ref 5–15)
BUN: 14 mg/dL (ref 6–20)
CO2: 21 mmol/L — ABNORMAL LOW (ref 22–32)
Calcium: 8.9 mg/dL (ref 8.9–10.3)
Chloride: 106 mmol/L (ref 98–111)
Creatinine, Ser: 1.02 mg/dL — ABNORMAL HIGH (ref 0.44–1.00)
GFR, Estimated: >60 mL/min (ref >60)
Glucose, Bld: 140 mg/dL — ABNORMAL HIGH (ref 70–99)
Potassium: 3.8 mmol/L (ref 3.5–5.1)
Sodium: 138 mmol/L (ref 135–145)

## 2023-04-25 LAB — TROPONIN I (HIGH SENSITIVITY)
Troponin I (High Sensitivity): 21 ng/L — ABNORMAL HIGH (ref <18)
Troponin I (High Sensitivity): 23 ng/L — ABNORMAL HIGH (ref <18)

## 2023-04-25 MED ORDER — AMLODIPINE BESYLATE 5 MG PO TABS
5.0000 mg | ORAL_TABLET | Freq: Once | ORAL | Status: AC
  Administered 2023-04-25: 5 mg via ORAL
  Filled 2023-04-25: qty 1

## 2023-04-25 MED ORDER — LABETALOL HCL 5 MG/ML IV SOLN
5.0000 mg | Freq: Once | INTRAVENOUS | Status: AC
  Administered 2023-04-25: 5 mg via INTRAVENOUS
  Filled 2023-04-25: qty 4

## 2023-04-25 NOTE — ED Notes (Signed)
Patient verbalizes understanding of discharge instructions. Opportunity for questioning and answers were provided. Armband removed by staff, pt discharged from ED. Pt ambulatory to ED waiting room with steady gait.  

## 2023-04-25 NOTE — Discharge Instructions (Signed)
You were seen today for high blood pressure and chest pain.  Your chest pain is likely related to your blood pressure being high.  Please make sure to take your medications every day.  I recommend that you follow-up with your primary care doctor early next week for a blood pressure check.

## 2023-04-25 NOTE — ED Provider Notes (Signed)
[place] EMERGENCY DEPARTMENT AT [facility] Provider Note   CSN: [id number] Arrival date & time: 04/25/23  1748     History  Chief Complaint  Patient presents with   Hypertension    Nicole Ryan is a 47 y.o. female.   Hypertension  Patient reports a 4-day history of intermittent central chest pain.  She states today she noticed worsening of the pain therefore called her primary care doctor however was recommended to come to the emergency department to be evaluated.  She reports that this morning she additionally had some congestion and mild headache therefore she took Sudafed.  Her headache has completely resolved however her chest pain has worsened.  She reports that she is supposed to be on antihypertensive medications however has not been taking them this week because she has been busy.     Home Medications Prior to Admission medications   Medication Sig Start Date End Date Taking? Authorizing Provider  acetaminophen (TYLENOL) 500 MG tablet Take 1 tablet (500 mg total) by mouth every 6 (six) hours as needed. 10/17/22   Milas Hock, MD  albuterol (VENTOLIN HFA) 108 (90 Base) MCG/ACT inhaler Inhale 2 puffs into the lungs every 6 (six) hours as needed. 08/19/19   Saguier, Ramon Dredge, PA-C  amLODipine (NORVASC) 5 MG tablet Take 1 tablet by mouth once daily 02/11/23   Saguier, Ramon Dredge, PA-C  atorvastatin (LIPITOR) 10 MG tablet Take 1 tablet by mouth once daily 01/16/23   Saguier, Ramon Dredge, PA-C  fluticasone [facility]) 50 MCG/ACT nasal spray Place 2 sprays into both nostrils daily. Patient taking differently: Place 2 sprays into both nostrils 2 (two) times daily as needed for allergies. 05/01/22   Saguier, Ramon Dredge, PA-C  ibuprofen (ADVIL) 800 MG tablet Take 1 tablet (800 mg total) by mouth 3 (three) times daily with meals as needed for headache, moderate pain or cramping. 10/17/22   Milas Hock, MD  losartan (COZAAR) 100 MG tablet Take 1 tablet by mouth once daily 12/12/22    Saguier, Ramon Dredge, PA-C  Mefenamic Acid 250 MG CAPS Take 2 caps PO at onset of menses. May repeat every 8 hours as needed for pain/ cramping for maximum 7 days use. 06/02/22   Crain, Whitney L, PA  metFORMIN (GLUCOPHAGE) 1000 MG tablet Take 1 tablet (1,000 mg total) by mouth 2 (two) times daily with a meal. 05/03/22   Saguier, Ramon Dredge, PA-C  metoprolol succinate (TOPROL-XL) 50 MG 24 hr tablet TAKE 1 TABLET BY MOUTH ONCE DAILY. TAKE WITH OR IMMEDIATELY FOLLOWING A MEAL 01/16/23   Saguier, Ramon Dredge, PA-C  montelukast (SINGULAIR) 10 MG tablet Take 10 mg by mouth at bedtime.    [provider]  norethindrone (ORTHO MICRONOR) 0.35 MG tablet Take one tab daily while on menses 06/02/22   Crain, Whitney L, PA  oxyCODONE (ROXICODONE) 5 MG immediate release tablet Take 1 tablet (5 mg total) by mouth every 4 (four) hours as needed for severe pain. 10/17/22   Milas Hock, MD      Allergies    Patient has no known allergies.    Review of Systems   Review of Systems  Physical Exam Updated Vital Signs BP (!) 194/93 (BP Location: Left Arm)   Pulse 89   Temp 97.8 F (36.6 C) (Oral)   Resp 17   Ht 5\' 7"  (1.702 m)   Wt 95.3 kg   SpO2 96%   BMI 32.89 kg/m  Physical Exam Vitals and nursing note reviewed.  Constitutional:      General:  She is not in acute distress.    Appearance: She is well-developed.  HENT:     Head: Normocephalic and atraumatic.  Eyes:     Conjunctiva/sclera: Conjunctivae normal.  Cardiovascular:     Rate and Rhythm: Normal rate and regular rhythm.     Heart sounds: No murmur heard. Pulmonary:     Effort: Pulmonary effort is normal. No respiratory distress.     Breath sounds: Normal breath sounds.  Abdominal:     Palpations: Abdomen is soft.     Tenderness: There is no abdominal tenderness.  Musculoskeletal:        General: No swelling.     Cervical back: Neck supple.  Skin:    General: Skin is warm and dry.     Capillary Refill: Capillary refill takes less than 2  seconds.  Neurological:     Mental Status: She is alert.  Psychiatric:        Mood and Affect: Mood normal.     ED Results / Procedures / Treatments   Labs (all labs ordered are listed, but only abnormal results are displayed) Labs Reviewed  BASIC METABOLIC PANEL - Abnormal; Notable for the following components:      Result Value   CO2 21 (*)    Glucose, Bld 140 (*)    Creatinine, Ser 1.02 (*)    All other components within normal limits  TROPONIN I (HIGH SENSITIVITY) - Abnormal; Notable for the following components:   Troponin I (High Sensitivity) 21 (*)    All other components within normal limits  TROPONIN I (HIGH SENSITIVITY) - Abnormal; Notable for the following components:   Troponin I (High Sensitivity) 23 (*)    All other components within normal limits  CBC WITH DIFFERENTIAL/PLATELET    EKG EKG Interpretation Date/Time:  Thursday April 25 2023 18:48:00 EDT Ventricular Rate:  85 PR Interval:  140 QRS Duration:  76 QT Interval:  390 QTC Calculation: 464 R Axis:   11  Text Interpretation: Normal sinus rhythm Minimal voltage criteria for LVH, may be normal variant ( R in aVL ) Borderline ECG No previous ECGs available Confirmed by Richardean Canal [id number]) on 04/25/2023 7:08:08 PM  Radiology DG Chest 1 View  Result Date: 04/25/2023 CLINICAL DATA:  Chest pain.  Epigastric pain. EXAM: CHEST  1 VIEW COMPARISON:  None Available. FINDINGS: The cardiomediastinal contours are normal for AP technique. The lungs are clear. Pulmonary vasculature is normal. No consolidation, pleural effusion, or pneumothorax. No acute osseous abnormalities are seen. IMPRESSION: No acute chest findings. Electronically Signed   By: Narda Rutherford M.D.   On: 04/25/2023 20:12    Procedures Procedures    Medications Ordered in ED Medications  labetalol (NORMODYNE) injection 5 mg (5 mg Intravenous Given 04/25/23 1919)  amLODipine (NORVASC) tablet 5 mg (5 mg Oral Given 04/25/23 1918)  labetalol  (NORMODYNE) injection 5 mg (5 mg Intravenous Given 04/25/23 2043)    ED Course/ Medical Decision Making/ A&P                                 Medical Decision Making Amount and/or Complexity of Data Reviewed Labs: ordered. Radiology: ordered.  Risk Prescription drug management.   Patient is a 47 year old female with a history of hypertension presenting for chest pain.  On my initial evaluation, she is afebrile, hypertensive to 218/106 in no acute distress.  Reports several days of intermittent chest pain that worsened  and became more constant today on exam, she has 2+ pulses in all 4 distal extremities with increased work of breathing.  Presentation is most concerning for hypertensive emergency, will obtain labs to further evaluate for endorgan damage.  Considered ACS, EKG reviewed and without ST elevations, depressions, new T wave inversions to suggest ongoing ischemia.  Given her elevated pressure, could consider aortic dissection however pain is not described as ripping, tearing, does not radiate to the back, she has no pulse deficit or other symptoms.  Results reviewed.  CBC without leukocytosis or anemia.  CMP with overall normal electrolytes, no AKI or anion gap.  Initial troponin 21, repeat 23.  Chest x-ray without acute findings.  Patient given labetalol and her home amlodipine for hypertension.  Following this, persistently hypertensive therefore labetalol was redosed.  Following these interventions, blood pressure improved to 180/101.  Given improvement in blood pressure and above reassuring workup, feel that she is appropriate for discharge with outpatient follow-up.  Discussed with patient the importance of compliance with her antihypertensive regimen.  Recommend outpatient follow-up for later this week for blood pressure check.  Patient reports understanding and agreement.  Discharged up further acute event under my care in the emergency department.        Final Clinical  Impression(s) / ED Diagnoses Final diagnoses:  Hypertension, unspecified type    Rx / DC Orders ED Discharge Orders     None         Claretha Cooper, DO 04/26/23 1412    Charlynne Pander, MD 04/29/23 1740

## 2023-04-25 NOTE — Telephone Encounter (Signed)
FYI: This call has been transferred to triage nurse: the Triage Nurse. Once the result note has been entered staff can address the message at that time.  Patient called in with the following symptoms:  Red Word:chest pain and Headache   Please advise at Mobile [phone] (mobile)  Message is routed to Provider Pool.

## 2023-04-25 NOTE — ED Triage Notes (Addendum)
Patient brought in by [facility] for chest pain, noted to be epigastric pain upon EMS arrival. Pain brought on with relaxation, no pain with ambulation. EMS vitals HR 110, first manual BP 300/140, spo2 100%. CBG 152. Last manual 220/120.Headache earlier 0300, took sinus medication with relief. Patient took 324mg  ASA at home prior to arrival of EMS. Patient is prescribed anti-hypertensive, but reports she doesn't take meds as prescribed. Last dose on Tuesday.

## 2023-04-25 NOTE — ED Notes (Signed)
Patient transported to X-ray 

## 2023-04-26 NOTE — Telephone Encounter (Signed)
Initial Comment Caller states she needs to speak with a nurse about a dull, uncomfortable pain in chest (between breasts, where ribcage starts not heart), headache, nausea and sleepy. Always tired, but more tired this week. Happening since Saturday. GOTO Facility Not Listed instructed to call 911, nearest ED Translation No Nurse Assessment Nurse: Hassan Rowan, RN, Melissa Date/Time (Eastern Time): 04/25/2023 4:34:15 PM Confirm and document reason for call. If symptomatic, describe symptoms. ---caller states having chest pain where rib cage starts, dull uncomfortable pain-since Saturday, headache, nausea-since Saturday and sleepy-states tired all the time, pain in chest at 4/10 now, denies current headache, denies fever, denis vomiting or diarrhea, denies difficulty breathing Does the patient have any new or worsening symptoms? ---Yes Will a triage be completed? ---Yes Related visit to physician within the last 2 weeks? ---No Does the PT have any chronic conditions? (i.e. diabetes, asthma, this includes High risk factors for pregnancy, etc.) ---Yes List chronic conditions. ---DM, HTN, inhaler use but no asthma Is the patient pregnant or possibly pregnant? (Ask all females between the ages of 28-55) ---No Is this a behavioral health or substance abuse call? ---No PLEASE NOTE: All timestamps contained within this report are represented as [place] Standard Time. CONFIDENTIALTY NOTICE: This fax transmission is intended only for the addressee. It contains information that is legally privileged, confidential or otherwise protected from use or disclosure. If you are not the intended recipient, you are strictly prohibited from reviewing, disclosing, copying using or disseminating any of this information or taking any action in reliance on or regarding this information. If you have received this fax in error, please notify us immediately by telephone so that we can arrange for its return to [place]. Phone:  [phone], Toll-Free: [phone], Fax: [phone] Page: 2 of 2 Call Id: [id number] Guidelines Guideline Title Affirmed Question Affirmed Notes Nurse Date/Time Lamount Cohen Time) Chest Pain [1] Chest pain lasts > 5 minutes AND [2] age > 61 Camery, RN, Melissa 04/25/2023 4:38:24 PM Disp. Time Lamount Cohen Time) Disposition Final User 04/25/2023 4:39:58 PM Call EMS 911 Now Yes Camery, RN, Melissa 04/25/2023 4:44:02 PM 911 Outcome Documentation Camery, RN, Melissa Reason: instructed caller to call 911, called back to check on pt, no answer, left voicemail Final Disposition 04/25/2023 4:39:58 PM Call EMS 911 Now Yes Camery, RN, Melissa Caller Disagree/Comply Comply Caller Understands Yes PreDisposition InappropriateToAsk Care Advice Given Per Guideline CALL EMS 911 NOW: * Immediate medical attention is needed. You need to hang up and call 911 (or an ambulance). * Triager Discretion: I'll call you back in a few minutes to be sure you were able to reach them. CARE ADVICE given per Chest Pain (Adult) guideline. Referrals GO TO FACILITY OTHER - SPECIFY

## 2023-04-26 NOTE — Telephone Encounter (Signed)
Pt seen in ED 

## 2023-05-31 ENCOUNTER — Other Ambulatory Visit: Payer: Self-pay | Admitting: Medical

## 2023-06-06 ENCOUNTER — Other Ambulatory Visit: Payer: Self-pay | Admitting: Medical

## 2023-06-19 ENCOUNTER — Other Ambulatory Visit: Payer: Self-pay | Admitting: Medical

## 2023-06-25 ENCOUNTER — Encounter: Payer: Self-pay | Admitting: Medical

## 2023-06-25 ENCOUNTER — Ambulatory Visit (INDEPENDENT_AMBULATORY_CARE_PROVIDER_SITE_OTHER): Payer: BC Managed Care – PPO | Admitting: Medical

## 2023-06-25 VITALS — BP 148/88 | HR 70 | Temp 98.0°F | Resp 18 | Ht 67.0 in | Wt 217.6 lb

## 2023-06-25 DIAGNOSIS — Z23 Encounter for immunization: Secondary | ICD-10-CM | POA: Diagnosis not present

## 2023-06-25 DIAGNOSIS — Z1211 Encounter for screening for malignant neoplasm of colon: Secondary | ICD-10-CM

## 2023-06-25 DIAGNOSIS — Z13220 Encounter for screening for lipoid disorders: Secondary | ICD-10-CM | POA: Diagnosis not present

## 2023-06-25 DIAGNOSIS — E119 Type 2 diabetes mellitus without complications: Secondary | ICD-10-CM | POA: Diagnosis not present

## 2023-06-25 DIAGNOSIS — Z7984 Long term (current) use of oral hypoglycemic drugs: Secondary | ICD-10-CM

## 2023-06-25 DIAGNOSIS — Z0000 Encounter for general adult medical examination without abnormal findings: Secondary | ICD-10-CM

## 2023-06-25 DIAGNOSIS — Z113 Encounter for screening for infections with a predominantly sexual mode of transmission: Secondary | ICD-10-CM

## 2023-06-25 DIAGNOSIS — I10 Essential (primary) hypertension: Secondary | ICD-10-CM | POA: Diagnosis not present

## 2023-06-25 MED ORDER — AMLODIPINE BESYLATE 5 MG PO TABS: ORAL_TABLET | ORAL | 3 refills | Status: DC

## 2023-06-25 NOTE — Patient Instructions (Addendum)
For you wellness exam today I have ordered cbc, cmp, lipid panel  and hiv.  Vaccine given today flu vaccine  Recommend exercise and healthy diet.  We will let you know lab results as they come in.  Follow up date appointment will be determined after lab review. (Want you to have nurse at work check bp manually and update me on that reading in one week)  Htn- mild high today. Continue losartan 100 mg daily and toprol xl 50 mg daily. With bp level today best to increase amlodipine to 5 mg 2 tab daily.  For diabetest- check A1c and see if additional med needs to be added on to metformin.  Continue atorvastatin. Rx refill today.

## 2023-06-25 NOTE — Addendum Note (Signed)
Addended by: Maximino Sarin on: 06/25/2023 01:50 PM   Modules accepted: Orders

## 2023-06-25 NOTE — Progress Notes (Signed)
Subjective:    Patient ID: Nicole Ryan, female    DOB: 02-18-1976, 47 y.o.   MRN: [id number]  HPI  Pt in for wellness exam. Pt is not fasting.   Pt in for cpe/wellness exam. Has school form to fill out for employement.   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 Pt not exercising. Pt diet has been moderate healthy. Cutting back on sugar. None smoker. No alcohol recently for months. Rare caffeine beverage.  Pt never had colonoscopy.   Pt will get flu vaccine today.  Pt had pap smear this year 08-22-2022. Gyn did place mammogram order.  Htn- pt bp hgh today. She take her bp med routinely at night and has not skipped dose.  Pt is on losartan 100 mg daily, amlodipine 5 mg daily and toprol xl 50 mg daily.  Pt is diabetic. Pt is on metformin 1000 mg twice daily. Last A1c was 9.8 past 04-2022.    Review of Systems  Constitutional:  Negative for chills, fatigue and fever.  HENT:  Negative for congestion, ear pain, postnasal drip and sinus pressure.   Respiratory:  Negative for cough, chest tightness, shortness of breath and wheezing.   Cardiovascular:  Negative for chest pain and palpitations.  Gastrointestinal:  Negative for abdominal pain.  Genitourinary:  Negative for dysuria, flank pain and frequency.  Musculoskeletal:  Negative for back pain.  Skin:  Negative for rash.  Neurological:  Negative for dizziness, speech difficulty, numbness and headaches.  Psychiatric/Behavioral:  Negative for behavioral problems and confusion.     Past Medical History:  Diagnosis Date   Allergy    Diabetes mellitus without complication (HCC)    GERD (gastroesophageal reflux disease)    heart burn. 2-3 times a week.   Headache    Hypertension      Social History   Socioeconomic History   Marital status: Single    Spouse name: Not on file   Number of children: Not on file   Years of education: Not on file   Highest education level: Not on file  Occupational History   Occupation:  Geologist, engineering  Tobacco Use   Smoking status: Never   Smokeless tobacco: Never  Vaping Use   Vaping status: Never Used  Substance and Sexual Activity   Alcohol use: Not Currently    Comment: occasionally   Drug use: Never   Sexual activity: Not Currently  Other Topics Concern   Not on file  Social History Narrative   Not on file   Social Determinants of Health   Financial Resource Strain: Not on file  Food Insecurity: Not on file  Transportation Needs: Not on file  Physical Activity: Not on file  Stress: Not on file  Social Connections: Unknown (01/02/2022)   Received from [facility]   Social Network    Social Network: Not on file  Intimate Partner Violence: Unknown (11/23/2021)   Received from [facility], [facility]   HITS    Physically Hurt: Not on file    Insult or Talk Down To: Not on file    Threaten Physical Harm: Not on file    Scream or Curse: Not on file    Past Surgical History:  Procedure Laterality Date   BREAST BIOPSY Bilateral    breast surg Bilateral    Beign tumors both breast   DILITATION & CURRETTAGE/HYSTROSCOPY WITH NOVASURE ABLATION N/A 10/17/2022   Procedure: DILATATION & CURETTAGE/HYSTEROSCOPY WITH NOVASURE ABLATION;  Surgeon: Milas Hock, MD;  Location: [facility] OR;  Service: Gynecology;  Laterality: N/A;    Family History  Problem Relation Age of Onset   Aneurysm Mother    Hypertension Mother    Diabetes Mother     No Known Allergies  Current Outpatient Medications on File Prior to Visit  Medication Sig Dispense Refill   acetaminophen (TYLENOL) 500 MG tablet Take 1 tablet (500 mg total) by mouth every 6 (six) hours as needed. 30 tablet 0   albuterol (VENTOLIN HFA) 108 (90 Base) MCG/ACT inhaler Inhale 2 puffs into the lungs every 6 (six) hours as needed. 18 g 0   atorvastatin (LIPITOR) 10 MG tablet Take 1 tablet by mouth once daily 90 tablet 0   fluticasone (FLONASE) 50 MCG/ACT nasal spray Place 2 sprays into both  nostrils daily. (Patient taking differently: Place 2 sprays into both nostrils 2 (two) times daily as needed for allergies.) 16 g 3   ibuprofen (ADVIL) 800 MG tablet Take 1 tablet (800 mg total) by mouth 3 (three) times daily with meals as needed for headache, moderate pain or cramping. 60 tablet 0   losartan (COZAAR) 100 MG tablet Take 1 tablet by mouth once daily 90 tablet 0   Mefenamic Acid 250 MG CAPS Take 2 caps PO at onset of menses. May repeat every 8 hours as needed for pain/ cramping for maximum 7 days use. 28 capsule 0   metFORMIN (GLUCOPHAGE) 1000 MG tablet Take 1 tablet (1,000 mg total) by mouth 2 (two) times daily with a meal. 60 tablet 11   metoprolol succinate (TOPROL-XL) 50 MG 24 hr tablet TAKE 1 TABLET BY MOUTH ONCE DAILY --TAKE  WITH  IMMEDIATELY  FOLLOWING  A  MEAL 90 tablet 0   montelukast (SINGULAIR) 10 MG tablet Take 10 mg by mouth at bedtime.     norethindrone (ORTHO MICRONOR) 0.35 MG tablet Take one tab daily while on menses 28 tablet 0   oxyCODONE (ROXICODONE) 5 MG immediate release tablet Take 1 tablet (5 mg total) by mouth every 4 (four) hours as needed for severe pain. 5 tablet 0   No current facility-administered medications on file prior to visit.    BP (!) 148/88   Pulse 70   Temp 98 F (36.7 C)   Resp 18   Ht 5\' 7"  (1.702 m)   Wt 217 lb 9.6 oz (98.7 kg)   SpO2 99%   BMI 34.08 kg/m          Objective:   Physical Exam  General Mental Status- Alert. General Appearance- Not in acute distress.   Skin General: Color- Normal Color. Moisture- Normal Moisture.  Neck Carotid Arteries- Normal color. Moisture- Normal Moisture. No carotid bruits. No JVD.  Chest and Lung Exam Auscultation: Breath Sounds:-Normal.  Cardiovascular Auscultation:Rythm- Regular. Murmurs & Other Heart Sounds:Auscultation of the heart reveals- No Murmurs.  Abdomen Inspection:-Inspeection Normal. Palpation/Percussion:Note:No mass. Palpation and Percussion of the abdomen  reveal- Non Tender, Non Distended + BS, no rebound or guarding.    Neurologic Cranial Nerve exam:- CN III-XII intact(No nystagmus), symmetric smile. Strength:- 5/5 equal and symmetric strength both upper and lower extremities.       Assessment & Plan:      Patient Instructions  For you wellness exam today I have ordered cbc, cmp, lipid panel  and hiv.  Vaccine given today flu vaccine  Recommend exercise and healthy diet.  We will let you know lab results as they come in.  Follow up date appointment will be determined after  lab review. (Want you to have nurse at work check bp manually and update me on that reading in one week)  Htn- mild high today. Continue losartan 100 mg daily and toprol xl 50 mg daily. With bp level today best to increase amlodipine to 5 mg 2 tab daily.  For diabetest- check A1c and see if additional med needs to be added on to metformin.  Continue atorvastatin. Rx refill today.     Esperanza Richters, [place]   [id number] charge as did addressed htn and diabetes. Made adjustment to bp med regimen.

## 2023-06-26 LAB — CBC WITH DIFFERENTIAL/PLATELET
Basophils Absolute: 0.1 10*3/uL (ref 0.0–0.1)
Basophils Relative: 1 % (ref 0.0–3.0)
Eosinophils Absolute: 0.1 10*3/uL (ref 0.0–0.7)
Eosinophils Relative: 1.1 % (ref 0.0–5.0)
HCT: 44.4 % (ref 36.0–46.0)
Hemoglobin: 14.4 g/dL (ref 12.0–15.0)
Lymphocytes Relative: 37.7 % (ref 12.0–46.0)
Lymphs Abs: 2.9 10*3/uL (ref 0.7–4.0)
MCHC: 32.4 g/dL (ref 30.0–36.0)
MCV: 84.1 fL (ref 78.0–100.0)
Monocytes Absolute: 0.7 10*3/uL (ref 0.1–1.0)
Monocytes Relative: 9.2 % (ref 3.0–12.0)
Neutro Abs: 3.9 10*3/uL (ref 1.4–7.7)
Neutrophils Relative %: 51 % (ref 43.0–77.0)
Platelets: 374 10*3/uL (ref 150.0–400.0)
RBC: 5.28 Mil/uL — ABNORMAL HIGH (ref 3.87–5.11)
RDW: 13.6 % (ref 11.5–15.5)
WBC: 7.7 10*3/uL (ref 4.0–10.5)

## 2023-06-26 LAB — COMPREHENSIVE METABOLIC PANEL
ALT: 33 U/L (ref 0–35)
AST: 29 U/L (ref 0–37)
Albumin: 3.4 g/dL — ABNORMAL LOW (ref 3.5–5.2)
Alkaline Phosphatase: 107 U/L (ref 39–117)
BUN: 13 mg/dL (ref 6–23)
CO2: 25 meq/L (ref 19–32)
Calcium: 9.5 mg/dL (ref 8.4–10.5)
Chloride: 103 meq/L (ref 96–112)
Creatinine, Ser: 0.98 mg/dL (ref 0.40–1.20)
GFR: 69.66 mL/min (ref >60.00)
Glucose, Bld: 102 mg/dL — ABNORMAL HIGH (ref 70–99)
Potassium: 4.3 meq/L (ref 3.5–5.1)
Sodium: 137 meq/L (ref 135–145)
Total Bilirubin: 0.4 mg/dL (ref 0.2–1.2)
Total Protein: 6.6 g/dL (ref 6.0–8.3)

## 2023-06-26 LAB — LIPID PANEL
Cholesterol: 210 mg/dL — ABNORMAL HIGH (ref 0–200)
HDL: 69.7 mg/dL (ref >39.00)
LDL Cholesterol: 108 mg/dL — ABNORMAL HIGH (ref 0–99)
NonHDL: 140.07
Total CHOL/HDL Ratio: 3
Triglycerides: 160 mg/dL — ABNORMAL HIGH (ref 0.0–149.0)
VLDL: 32 mg/dL (ref 0.0–40.0)

## 2023-06-26 LAB — HIV ANTIBODY (ROUTINE TESTING W REFLEX): HIV 1&2 Ab, 4th Generation: NONREACTIVE

## 2023-06-26 LAB — HEMOGLOBIN A1C: Hgb A1c MFr Bld: 9 % — ABNORMAL HIGH (ref 4.6–6.5)

## 2023-06-27 MED ORDER — DAPAGLIFLOZIN PROPANEDIOL 10 MG PO TABS: 10.0000 mg | ORAL_TABLET | Freq: Every day | ORAL | 11 refills | Status: DC

## 2023-06-27 MED ORDER — ATORVASTATIN CALCIUM 10 MG PO TABS: 10.0000 mg | ORAL_TABLET | Freq: Every day | ORAL | 3 refills | Status: DC

## 2023-06-27 NOTE — Addendum Note (Signed)
Addended by: Gwenevere Abbot on: 06/27/2023 07:58 PM   Modules accepted: Orders

## 2023-06-27 NOTE — Addendum Note (Signed)
Addended by: Gwenevere Abbot on: 06/27/2023 07:59 PM   Modules accepted: Orders

## 2023-07-01 ENCOUNTER — Telehealth: Payer: Self-pay

## 2023-07-01 NOTE — Telephone Encounter (Signed)
Follow up letter sent.

## 2023-07-17 ENCOUNTER — Other Ambulatory Visit: Payer: Self-pay | Admitting: Medical

## 2023-08-02 ENCOUNTER — Telehealth: Payer: Self-pay | Admitting: *Deleted

## 2023-08-02 NOTE — Telephone Encounter (Signed)
Left patient a message to call and schedule annual that is due after 08/23/2023.

## 2023-10-03 ENCOUNTER — Other Ambulatory Visit: Payer: Self-pay | Admitting: Medical

## 2023-10-07 ENCOUNTER — Other Ambulatory Visit: Payer: Self-pay | Admitting: Medical

## 2023-10-30 ENCOUNTER — Other Ambulatory Visit: Payer: Self-pay | Admitting: Medical

## 2023-10-31 ENCOUNTER — Encounter: Payer: Self-pay | Admitting: *Deleted

## 2023-11-11 ENCOUNTER — Ambulatory Visit (INDEPENDENT_AMBULATORY_CARE_PROVIDER_SITE_OTHER): Payer: Self-pay | Admitting: Medical

## 2023-11-11 ENCOUNTER — Encounter: Payer: Self-pay | Admitting: Medical

## 2023-11-11 VITALS — BP 155/80 | HR 68 | Temp 98.1°F | Resp 18 | Ht 67.0 in | Wt 219.2 lb

## 2023-11-11 DIAGNOSIS — Z111 Encounter for screening for respiratory tuberculosis: Secondary | ICD-10-CM | POA: Diagnosis not present

## 2023-11-11 DIAGNOSIS — Z7984 Long term (current) use of oral hypoglycemic drugs: Secondary | ICD-10-CM | POA: Diagnosis not present

## 2023-11-11 DIAGNOSIS — E119 Type 2 diabetes mellitus without complications: Secondary | ICD-10-CM | POA: Diagnosis not present

## 2023-11-11 DIAGNOSIS — Z23 Encounter for immunization: Secondary | ICD-10-CM | POA: Diagnosis not present

## 2023-11-11 DIAGNOSIS — E785 Hyperlipidemia, unspecified: Secondary | ICD-10-CM

## 2023-11-11 DIAGNOSIS — Z1211 Encounter for screening for malignant neoplasm of colon: Secondary | ICD-10-CM

## 2023-11-11 NOTE — Progress Notes (Unsigned)
 Subjective:    Patient ID: Nicole Ryan, female    DOB: Dec 15, 1975, 48 y.o.   MRN: [id number]  HPI  Veneda Coard is a 48 year old female with diabetes and hypertension who presents for routine follow-up and screening tests.  She has a history of diabetes with her last HbA1c recorded as 9% in November. She is currently on metformin 1000 mg twice daily. There is some improvement in her diet, influenced by her health-conscious son, with a reduction in processed carbohydrates and sodas, although she still consumes pasta and orange juice regularly. She has not been taking [place] due to cost concerns.  She has hypertension and is currently taking amlodipine 5 mg daily, metoprolol XL 50 mg daily, and losartan 100 mg daily. She recalls a past issue with swelling in her feet, which resolved after switching to metoprolol. She does not regularly check her blood pressure at home but can have it checked at her workplace by the school nurse.  She had a Pap smear on August 22, 2018, which showed atypical squamous cells of undetermined significance and tested positive for HPV, though not type 16 or 18. She is scheduled for a repeat Pap smear and HPV test in one year.  She has not had a colonoscopy before and is now 48 years old, approaching 36. She has not received a pneumonia vaccine, although it is recommended due to her diabetes. 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           Review of Systems  Constitutional:  Negative for chills, fatigue and fever.  HENT:  Negative for congestion.   Respiratory:  Negative for cough, chest tightness and wheezing.   Cardiovascular:  Negative for chest pain and palpitations.  Gastrointestinal:  Negative for abdominal pain, constipation and nausea.  Genitourinary:  Negative for dysuria and frequency.  Musculoskeletal:  Negative for back pain, myalgias, neck pain and neck stiffness.  Skin:  Negative for rash.   Neurological:  Negative for dizziness, weakness and light-headedness.  Hematological:  Negative for adenopathy.  Psychiatric/Behavioral:  Negative for agitation, dysphoric mood and suicidal ideas. The patient is not nervous/anxious and is not hyperactive.     Past Medical History:  Diagnosis Date   Allergy    Diabetes mellitus without complication (HCC)    GERD (gastroesophageal reflux disease)    heart burn. 2-3 times a week.   Headache    Hypertension      Social History   Socioeconomic History   Marital status: Single    Spouse name: Not on file   Number of children: Not on file   Years of education: Not on file   Highest education level: Not on file  Occupational History   Occupation: Geologist, engineering  Tobacco Use   Smoking status: Never   Smokeless tobacco: Never  Vaping Use   Vaping status: Never Used  Substance and Sexual Activity   Alcohol use: Not Currently    Comment: occasionally   Drug use: Never   Sexual activity: Not Currently  Other Topics Concern   Not on file  Social History Narrative   Not on file   Social Drivers of Health   Financial Resource Strain: Not on file  Food Insecurity: Not on file  Transportation Needs: Not on file  Physical Activity: Not on file  Stress: Not on file  Social Connections: Unknown (01/02/2022)   Received from [facility], [facility]   Social Network  Social Network: Not on file  Intimate Partner Violence: Unknown (11/23/2021)   [facility] from [facility], [facility]   HITS    Physically Hurt: Not on file    Insult or Talk Down To: Not on file    Threaten Physical Harm: Not on file    Scream or Curse: Not on file    Past Surgical History:  Procedure Laterality Date   BREAST BIOPSY Bilateral    breast surg Bilateral    Beign tumors both breast   DILITATION & CURRETTAGE/HYSTROSCOPY WITH NOVASURE ABLATION N/A 10/17/2022   Procedure: DILATATION & CURETTAGE/HYSTEROSCOPY WITH NOVASURE ABLATION;   Surgeon: Milas Hock, MD;  Location: [facility] OR;  Service: Gynecology;  Laterality: N/A;    Family History  Problem Relation Age of Onset   Aneurysm Mother    Hypertension Mother    Diabetes Mother     No Known Allergies  Current Outpatient Medications on File Prior to Visit  Medication Sig Dispense Refill   acetaminophen (TYLENOL) 500 MG tablet Take 1 tablet (500 mg total) by mouth every 6 (six) hours as needed. 30 tablet 0   albuterol (VENTOLIN HFA) 108 (90 Base) MCG/ACT inhaler Inhale 2 puffs into the lungs every 6 (six) hours as needed. 18 g 0   amLODipine (NORVASC) 5 MG tablet 2 tab po daily 180 tablet 3   atorvastatin (LIPITOR) 10 MG tablet Take 1 tablet (10 mg total) by mouth daily. 90 tablet 3   fluticasone (FLONASE) 50 MCG/ACT nasal spray Place 2 sprays into both nostrils daily. 16 g 3   ibuprofen (ADVIL) 800 MG tablet Take 1 tablet (800 mg total) by mouth 3 (three) times daily with meals as needed for headache, moderate pain or cramping. 60 tablet 0   losartan (COZAAR) 100 MG tablet Take 1 tablet by mouth once daily 90 tablet 0   metFORMIN (GLUCOPHAGE) 1000 MG tablet TAKE 1 TABLET BY MOUTH TWICE DAILY WITH A MEAL 60 tablet 0   metoprolol succinate (TOPROL-XL) 50 MG 24 hr tablet TAKE 1 TABLET BY MOUTH ONCE DAILY---TAKE WITH  IMMEDIATELY  FOLLOWING  A  MEAL 90 tablet 0   montelukast (SINGULAIR) 10 MG tablet TAKE 1 TABLET BY MOUTH AT BEDTIME 90 tablet 0   dapagliflozin propanediol (FARXIGA) 10 MG TABS tablet Take 1 tablet (10 mg total) by mouth daily before breakfast. (Patient not taking: Reported on 11/11/2023) 30 tablet 11   No current facility-administered medications on file prior to visit.    BP (!) 155/80   Pulse 68   Temp 98.1 F (36.7 C) (Oral)   Resp 18   Ht 5\' 7"  (1.702 m)   Wt 219 lb 3.2 oz (99.4 kg)   SpO2 99%   BMI 34.33 kg/m        Objective:   Physical Exam  General Mental Status- Alert. General Appearance- Not in acute distress.   Skin General:  Color- Normal Color. Moisture- Normal Moisture.  Neck Carotid Arteries- Normal color. Moisture- Normal Moisture. No carotid bruits. No JVD.  Chest and Lung Exam Auscultation: Breath Sounds:-Normal.  Cardiovascular Auscultation:Rythm- Regular. Murmurs & Other Heart Sounds:Auscultation of the heart reveals- No Murmurs.  Abdomen Inspection:-Inspeection Normal. Palpation/Percussion:Note:No mass. Palpation and Percussion of the abdomen reveal- Non Tender, Non Distended + BS, no rebound or guarding.   Neurologic Cranial Nerve exam:- CN III-XII intact(No nystagmus), symmetric smile. Strength:- 5/5 equal and symmetric strength both upper and lower extremities.      Assessment & Plan:   Patient Instructions  Diabetes Mellitus Type 2 Type 2 Diabetes Mellitus with poor glycemic control, HbA1c 9% in November. Not taking Marcelline Deist due to cost. Dietary improvements noted, but daily orange juice intake may contribute to hyperglycemia. - Order HbA1c test and cmp. - Continue metformin 1000 mg twice daily. - Discuss potential addition of another medication if HbA1c remains elevated. - Advise to reduce orange juice intake.  Hypertension Hypertension with suboptimal control, current blood pressure 155/80 mmHg. On losartan, metoprolol XL, and amlodipine. Need to verify medication adherence and dosages. - Verify antihypertensive medication adherence and dosages via MyChart. - Consider switching losartan to valsartan 160 mg if regimen confirmed and blood pressure remains elevated.  Hyperlipidemia Hyperlipidemia managed with atorvastatin. Current lipid panel results pending. - Order lipid panel. - Continue atorvastatin.  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 HPV type not 16 or 18. - Repeat Pap smear and HPV test in January 2026 thru gyn.  General Health Maintenance Eligible for pneumonia  vaccine due to diabetes. Informed consent obtained for PCV20 vaccine. - Administer PCV20 pneumonia vaccine in left arm. - Order screening colonoscopy. - Order TB blood test. - Order future urine microalbumin test.  Follow-up Follow-up contingent on lab results and medication verification. - Review lab results and update via MyChart. - Determine follow-up appointment based on lab review and medication verification.

## 2023-11-11 NOTE — Patient Instructions (Signed)
 Diabetes Mellitus Type 2 Type 2 Diabetes Mellitus with poor glycemic control, HbA1c 9% in November. Not taking Nicole Ryan due to cost. Dietary improvements noted, but daily orange juice intake may contribute to hyperglycemia. - Order HbA1c test and cmp. - Continue metformin 1000 mg twice daily. - Discuss potential addition of another medication if HbA1c remains elevated. - Advise to reduce orange juice intake.  Hypertension Hypertension with suboptimal control, current blood pressure 155/80 mmHg. On losartan, metoprolol XL, and amlodipine. Need to verify medication adherence and dosages. - Verify antihypertensive medication adherence and dosages via MyChart. - Consider switching losartan to valsartan 160 mg if regimen confirmed and blood pressure remains elevated.  Hyperlipidemia Hyperlipidemia managed with atorvastatin. Current lipid panel results pending. - Order lipid panel. - Continue atorvastatin.  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 HPV type not 16 or 18. - Repeat Pap smear and HPV test in January 2026 thru gyn.  General Health Maintenance Eligible for pneumonia vaccine due to diabetes. Informed consent obtained for PCV20 vaccine. - Administer PCV20 pneumonia vaccine in left arm. - Order screening colonoscopy. - Order TB blood test. - Order future urine microalbumin test.  Follow-up Follow-up contingent on lab results and medication verification. - Review lab results and update via MyChart. - Determine follow-up appointment based on lab review and medication verification.

## 2023-11-12 ENCOUNTER — Encounter: Payer: Self-pay | Admitting: Medical

## 2023-11-12 LAB — HEMOGLOBIN A1C: Hgb A1c MFr Bld: 9.4 % — ABNORMAL HIGH (ref 4.6–6.5)

## 2023-11-12 MED ORDER — CANAGLIFLOZIN 300 MG PO TABS: 300.0000 mg | ORAL_TABLET | Freq: Every day | ORAL | 11 refills | Status: DC

## 2023-11-12 MED ORDER — LOSARTAN POTASSIUM 100 MG PO TABS: 100.0000 mg | ORAL_TABLET | Freq: Every day | ORAL | 0 refills | Status: DC

## 2023-11-12 MED ORDER — VALSARTAN 160 MG PO TABS: 160.0000 mg | ORAL_TABLET | Freq: Every day | ORAL | 11 refills | Status: AC

## 2023-11-12 NOTE — Addendum Note (Signed)
 Addended by: Gwenevere Abbot on: 11/12/2023 09:56 PM   Modules accepted: Orders

## 2023-11-13 ENCOUNTER — Telehealth: Payer: Self-pay

## 2023-11-13 MED ORDER — EMPAGLIFLOZIN 25 MG PO TABS: 25.0000 mg | ORAL_TABLET | Freq: Every day | ORAL | 11 refills | Status: AC

## 2023-11-13 NOTE — Addendum Note (Signed)
 Addended by: Gwenevere Abbot on: 11/13/2023 04:46 PM   Modules accepted: Orders

## 2023-11-13 NOTE — Telephone Encounter (Signed)
 Copied from CRM [id number]. Topic: Clinical - Medication Question >> Nov 13, 2023 10:41 AM Cammy Copa D wrote: Reason for CRM: [facility], PT was prescribed Invokana, Pharmacist asking to change to insurances preferred version, [place] or [place].

## 2023-11-15 ENCOUNTER — Encounter: Payer: Self-pay | Admitting: Gastroenterology

## 2023-11-18 LAB — EXTRA SPECIMEN

## 2023-11-18 LAB — QUANTIFERON-TB GOLD PLUS

## 2023-11-18 NOTE — Addendum Note (Signed)
 Addended by: Gwenevere Abbot on: 11/18/2023 07:35 PM   Modules accepted: Orders

## 2023-11-19 ENCOUNTER — Other Ambulatory Visit (INDEPENDENT_AMBULATORY_CARE_PROVIDER_SITE_OTHER)

## 2023-11-19 ENCOUNTER — Telehealth: Payer: Self-pay | Admitting: Medical

## 2023-11-19 DIAGNOSIS — E119 Type 2 diabetes mellitus without complications: Secondary | ICD-10-CM

## 2023-11-19 DIAGNOSIS — Z111 Encounter for screening for respiratory tuberculosis: Secondary | ICD-10-CM

## 2023-11-19 NOTE — Telephone Encounter (Signed)
 Pt called and appt  made

## 2023-11-19 NOTE — Telephone Encounter (Signed)
 Copied from CRM [id number]. Topic: Appointments - Scheduling Inquiry for Clinic >> Nov 19, 2023  2:33 PM Kaye S wrote: Reason for CRM: [facility]-TB [facility] (Order [id number]) Patient stated her last visit was for the physical and TB test she said 2 tubes of blood was taken but not sure it wasn't tested for TB please call patient to further explain

## 2023-11-20 ENCOUNTER — Encounter: Payer: Self-pay | Admitting: Medical

## 2023-11-20 LAB — MICROALBUMIN / CREATININE URINE RATIO
Creatinine,U: 31.3 mg/dL
Microalb Creat Ratio: 5296.9 mg/g — ABNORMAL HIGH (ref 0.0–30.0)
Microalb, Ur: 165.7 mg/dL — ABNORMAL HIGH (ref 0.0–1.9)

## 2023-11-22 LAB — QUANTIFERON-TB GOLD PLUS
Mitogen-NIL: >10 [IU]/mL
NIL: 0.06 [IU]/mL
QuantiFERON-TB Gold Plus: NEGATIVE
TB1-NIL: 0 [IU]/mL
TB2-NIL: 0.01 [IU]/mL

## 2023-11-27 ENCOUNTER — Telehealth: Payer: Self-pay | Admitting: Medical

## 2023-11-27 NOTE — Telephone Encounter (Signed)
 Pt called and lvm to let her know form placed up front for pick up

## 2023-11-27 NOTE — Telephone Encounter (Signed)
 Will you fax over pt health assessment and tb screen form. Placing on your desk.

## 2023-12-01 ENCOUNTER — Other Ambulatory Visit: Payer: Self-pay | Admitting: Medical

## 2023-12-02 ENCOUNTER — Encounter: Payer: Self-pay | Admitting: Gastroenterology

## 2023-12-03 ENCOUNTER — Encounter

## 2023-12-24 ENCOUNTER — Encounter: Admitting: Gastroenterology

## 2024-01-15 ENCOUNTER — Other Ambulatory Visit: Payer: Self-pay | Admitting: Medical

## 2024-01-17 ENCOUNTER — Encounter: Payer: Self-pay | Admitting: Gastroenterology

## 2024-01-17 ENCOUNTER — Ambulatory Visit

## 2024-01-17 VITALS — Ht 67.0 in | Wt 210.0 lb

## 2024-01-17 DIAGNOSIS — Z1211 Encounter for screening for malignant neoplasm of colon: Secondary | ICD-10-CM

## 2024-01-17 MED ORDER — NA SULFATE-K SULFATE-MG SULF 17.5-3.13-1.6 GM/177ML PO SOLN: 1.0000 | Freq: Once | ORAL | 0 refills | Status: AC

## 2024-01-17 NOTE — Progress Notes (Signed)
 Pre visit completed via phone call; Patient verified name, DOB, and address; No egg or soy allergy known to patient;  No issues known to pt with past sedation with any surgeries or procedures; Patient denies ever being told they had issues or difficulty with intubation;  No FH of Malignant Hyperthermia; Pt is not on diet pills; Pt is not on home 02;  Pt is not on blood thinners;  Pt denies issues with constipation; No A fib or A flutter; Have any cardiac testing pending--NO Insurance verified during PV appt--- [employer] SHP Pt can ambulate without assistance;  Pt denies use of chewing tobacco Discussed diabetic/weight loss medication holds; Discussed NSAID holds; Checked BMI to be less than 50; Pt instructed to use Singlecare.com or GoodRx for a price reduction on prep  Patient's chart reviewed by Rogena Class CNRA prior to previsit and patient appropriate for the [facility].  Pre visit completed and red dot placed by patient's name on their procedure day (on provider's schedule).    Instructions sent to MyChart per patient request;

## 2024-01-20 ENCOUNTER — Telehealth: Payer: Self-pay | Admitting: Medical

## 2024-01-20 NOTE — Telephone Encounter (Signed)
 Copied from CRM [id number]. Topic: General - Other >> Jan 20, 2024  4:29 PM Adrionna Y wrote: Reason for CRM: Patient is calling because she would like a copy of her health assessment and tb form showing that she had it done

## 2024-01-22 NOTE — Telephone Encounter (Signed)
 Sent message via [employer]

## 2024-01-31 ENCOUNTER — Encounter: Payer: Self-pay | Admitting: Gastroenterology

## 2024-01-31 ENCOUNTER — Ambulatory Visit (AMBULATORY_SURGERY_CENTER): Admitting: Gastroenterology

## 2024-01-31 ENCOUNTER — Other Ambulatory Visit: Payer: Self-pay | Admitting: Medical

## 2024-01-31 VITALS — BP 154/84 | HR 64 | Temp 97.7°F | Resp 13 | Ht 67.0 in | Wt 210.0 lb

## 2024-01-31 DIAGNOSIS — Z1211 Encounter for screening for malignant neoplasm of colon: Secondary | ICD-10-CM | POA: Diagnosis present

## 2024-01-31 MED ORDER — SODIUM CHLORIDE 0.9 % IV SOLN: 500.0000 mL | Freq: Once | INTRAVENOUS | Status: DC

## 2024-01-31 NOTE — Progress Notes (Signed)
 [facility] History and Physical   Primary Care Physician:  Francine Iron   Reason for Procedure:   Colon cancer screening  Plan:    Screening colonoscopy     HPI: Nicole Ryan is a 48 y.o. female undergoing initial average risk screening colonoscopy.  She has no family history of colon cancer and no chronic GI symptoms.    Past Medical History:  Diagnosis Date   Allergy    Diabetes mellitus without complication (HCC)    GERD (gastroesophageal reflux disease)    heart burn. 2-3 times a week.   Headache    Hypertension     Past Surgical History:  Procedure Laterality Date   BREAST BIOPSY Bilateral    breast surg Bilateral    Beign tumors both breast   DILITATION & CURRETTAGE/HYSTROSCOPY WITH NOVASURE ABLATION N/A 10/17/2022   Procedure: DILATATION & CURETTAGE/HYSTEROSCOPY WITH NOVASURE ABLATION;  Surgeon: Lacey Pian, MD;  Location: [facility] OR;  Service: Gynecology;  Laterality: N/A;    Prior to Admission medications   Medication Sig Start Date End Date Taking? Authorizing Provider  amLODipine  (NORVASC ) 5 MG tablet 2 tab po daily 06/25/23  Yes Saguier, Gaylin Ke, PA-C  atorvastatin  (LIPITOR) 10 MG tablet Take 1 tablet (10 mg total) by mouth daily. 06/27/23  Yes Saguier, Gaylin Ke, PA-C  empagliflozin  (JARDIANCE ) 25 MG TABS tablet Take 1 tablet (25 mg total) by mouth daily. 11/13/23  Yes Saguier, Gaylin Ke, PA-C  metFORMIN  (GLUCOPHAGE ) 1000 MG tablet TAKE 1 TABLET BY MOUTH TWICE DAILY WITH A MEAL 12/02/23  Yes Saguier, Gaylin Ke, PA-C  metoprolol  succinate (TOPROL -XL) 50 MG 24 hr tablet TAKE 1 TABLET BY MOUTH ONCE DAILY---TAKE WITH  IMMEDIATELY  FOLLOWING  A  MEAL 10/03/23  Yes Saguier, Gaylin Ke, PA-C  montelukast  (SINGULAIR ) 10 MG tablet TAKE 1 TABLET BY MOUTH AT BEDTIME 01/15/24  Yes Saguier, Gaylin Ke, PA-C  valsartan  (DIOVAN ) 160 MG tablet Take 1 tablet (160 mg total) by mouth daily. 11/12/23  Yes Saguier, Gaylin Ke, PA-C  acetaminophen  (TYLENOL ) 500 MG tablet Take 1 tablet (500  mg total) by mouth every 6 (six) hours as needed. 10/17/22   Lacey Pian, MD  albuterol  (VENTOLIN  HFA) 108 (90 Base) MCG/ACT inhaler Inhale 2 puffs into the lungs every 6 (six) hours as needed. 08/19/19   Saguier, Gaylin Ke, PA-C  fluticasone  (FLONASE ) 50 MCG/ACT nasal spray Place 2 sprays into both nostrils daily. Patient taking differently: Place 2 sprays into both nostrils daily as needed. 07/17/23   Saguier, Gaylin Ke, PA-C    Current Outpatient Medications  Medication Sig Dispense Refill   amLODipine  (NORVASC ) 5 MG tablet 2 tab po daily 180 tablet 3   atorvastatin  (LIPITOR) 10 MG tablet Take 1 tablet (10 mg total) by mouth daily. 90 tablet 3   empagliflozin  (JARDIANCE ) 25 MG TABS tablet Take 1 tablet (25 mg total) by mouth daily. 30 tablet 11   metFORMIN  (GLUCOPHAGE ) 1000 MG tablet TAKE 1 TABLET BY MOUTH TWICE DAILY WITH A MEAL 180 tablet 0   metoprolol  succinate (TOPROL -XL) 50 MG 24 hr tablet TAKE 1 TABLET BY MOUTH ONCE DAILY---TAKE WITH  IMMEDIATELY  FOLLOWING  A  MEAL 90 tablet 0   montelukast  (SINGULAIR ) 10 MG tablet TAKE 1 TABLET BY MOUTH AT BEDTIME 90 tablet 0   valsartan  (DIOVAN ) 160 MG tablet Take 1 tablet (160 mg total) by mouth daily. 30 tablet 11   acetaminophen  (TYLENOL ) 500 MG tablet Take 1 tablet (500 mg total) by mouth every 6 (six) hours as needed. 30 tablet 0  albuterol  (VENTOLIN  HFA) 108 (90 Base) MCG/ACT inhaler Inhale 2 puffs into the lungs every 6 (six) hours as needed. 18 g 0   fluticasone  (FLONASE ) 50 MCG/ACT nasal spray Place 2 sprays into both nostrils daily. (Patient taking differently: Place 2 sprays into both nostrils daily as needed.) 16 g 3   Current Facility-Administered Medications  Medication Dose Route Frequency Provider Last Rate Last Admin   0.9 %  sodium chloride  infusion  500 mL Intravenous Once Elois Hair, MD        Allergies as of 01/31/2024   (No Known Allergies)    Family History  Problem Relation Age of Onset   Aneurysm Mother     Hypertension Mother    Diabetes Mother    Colon cancer Neg Hx    Colon polyps Neg Hx    Esophageal cancer Neg Hx    Stomach cancer Neg Hx    Ulcerative colitis Neg Hx     Social History   Socioeconomic History   Marital status: Single    Spouse name: Not on file   Number of children: Not on file   Years of education: Not on file   Highest education level: Not on file  Occupational History   Occupation: Geologist, engineering  Tobacco Use   Smoking status: Never   Smokeless tobacco: Never  Vaping Use   Vaping status: Never Used  Substance and Sexual Activity   Alcohol use: Not Currently    Comment: occasionally   Drug use: Never   Sexual activity: Not Currently  Other Topics Concern   Not on file  Social History Narrative   Not on file   Social Drivers of Health   Financial Resource Strain: Not on file  Food Insecurity: Not on file  Transportation Needs: Not on file  Physical Activity: Not on file  Stress: Not on file  Social Connections: Unknown (01/02/2022)   Received from [facility]   Social Network    Social Network: Not on file  Intimate Partner Violence: Unknown (11/23/2021)   Received from [facility]   HITS    Physically Hurt: Not on file    Insult or Talk Down To: Not on file    Threaten Physical Harm: Not on file    Scream or Curse: Not on file    Review of Systems:  All other review of systems negative except as mentioned in the HPI.  Physical Exam: Vital signs BP (!) 150/77   Pulse 70   Temp 97.7 F (36.5 C)   Ht 5' 7 (1.702 m)   Wt 210 lb (95.3 kg)   SpO2 98%   BMI 32.89 kg/m   General:   Alert,  Well-developed, well-nourished, pleasant and cooperative in NAD Airway:  Mallampati 2 Lungs:  Clear throughout to auscultation.   Heart:  Regular rate and rhythm; no murmurs, clicks, rubs,  or gallops. Abdomen:  Soft, nontender and nondistended. Normal bowel sounds.   Neuro/Psych:  Normal mood and affect. A and O x 3   Clemie General E.  Cherryl Corona, MD [facility]

## 2024-01-31 NOTE — Patient Instructions (Signed)
 Resume previous diet Continue present medications Repeat colonoscopy in 10 years for screening  YOU HAD AN ENDOSCOPIC PROCEDURE TODAY AT THE [facility]:   Refer to the procedure report that was given to you for any specific questions about what was found during the examination.  If the procedure report does not answer your questions, please call your gastroenterologist to clarify.  If you requested that your care partner not be given the details of your procedure findings, then the procedure report has been included in a sealed envelope for you to review at your convenience later.  YOU SHOULD EXPECT: Some feelings of bloating in the abdomen. Passage of more gas than usual.  Walking can help get rid of the air that was put into your GI tract during the procedure and reduce the bloating. If you had a lower endoscopy (such as a colonoscopy or flexible sigmoidoscopy) you may notice spotting of blood in your stool or on the toilet paper. If you underwent a bowel prep for your procedure, you may not have a normal bowel movement for a few days.  Please Note:  You might notice some irritation and congestion in your nose or some drainage.  This is from the oxygen used during your procedure.  There is no need for concern and it should clear up in a day or so.  SYMPTOMS TO REPORT IMMEDIATELY:  Following lower endoscopy (colonoscopy or flexible sigmoidoscopy):  Excessive amounts of blood in the stool  Significant tenderness or worsening of abdominal pains  Swelling of the abdomen that is new, acute  Fever of 100F or higher  For urgent or emergent issues, a gastroenterologist can be reached at any hour by calling [phone]. Do not use MyChart messaging for urgent concerns.   DIET:  We do recommend a small meal at first, but then you may proceed to your regular diet.  Drink plenty of fluids but you should avoid alcoholic beverages for 24 hours.  ACTIVITY:  You should plan to take it easy  for the rest of today and you should NOT DRIVE or use heavy machinery until tomorrow (because of the sedation medicines used during the test).    FOLLOW UP: Our staff will call the number listed on your records the next business day following your procedure.  We will call around 7:15- 8:00 am to check on you and address any questions or concerns that you may have regarding the information given to you following your procedure. If we do not reach you, we will leave a message.     If any biopsies were taken you will be contacted by phone or by letter within the next 1-3 weeks.  Please call us  at [phone] if you have not heard about the biopsies in 3 weeks.   SIGNATURES/CONFIDENTIALITY: You and/or your care partner have signed paperwork which will be entered into your electronic medical record.  These signatures attest to the fact that that the information above on your After Visit Summary has been reviewed and is understood.  Full responsibility of the confidentiality of this discharge information lies with you and/or your care-partner.

## 2024-01-31 NOTE — Progress Notes (Signed)
 Vss nad trans to pacu

## 2024-01-31 NOTE — Progress Notes (Signed)
 Pt's states no medical or surgical changes since previsit or office visit.

## 2024-01-31 NOTE — Op Note (Signed)
 [facility] Patient Name: Russell Engelstad Procedure Date: 01/31/2024 8:31 AM MRN: [id number] Endoscopist: Geralyn Knee E. Cherryl Corona , MD, [id number] Age: 48 Referring MD:  Date of Birth: [birth year] Gender: Female Account #: [id number] Procedure:                Colonoscopy Indications:              Screening for colorectal malignant neoplasm, This                            is the patient's first colonoscopy Medicines:                Monitored Anesthesia Care Procedure:                Pre-Anesthesia Assessment:                           - Prior to the procedure, a History and Physical                            was performed, and patient medications and                            allergies were reviewed. The patient's tolerance of                            previous anesthesia was also reviewed. The risks                            and benefits of the procedure and the sedation                            options and risks were discussed with the patient.                            All questions were answered, and informed consent                            was obtained. Prior Anticoagulants: The patient has                            taken no anticoagulant or antiplatelet agents. ASA                            Grade Assessment: III - A patient with severe                            systemic disease. After reviewing the risks and                            benefits, the patient was deemed in satisfactory                            condition to undergo the procedure.  After obtaining informed consent, the colonoscope                            was passed under direct vision. Throughout the                            procedure, the patient's blood pressure, pulse, and                            oxygen saturations were monitored continuously. The                            CF [id number] #[id number] was introduced through the anus                            and advanced  to the the cecum, identified by                            appendiceal orifice and ileocecal valve. The                            colonoscopy was performed without difficulty. The                            patient tolerated the procedure well. The quality                            of the bowel preparation was good. The ileocecal                            valve, appendiceal orifice, and rectum were                            photographed. The bowel preparation used was SUPREP                            via split dose instruction. Scope In: 8:35:33 AM Scope Out: 8:49:37 AM Scope Withdrawal Time: 0 hours 9 minutes 30 seconds  Total Procedure Duration: 0 hours 14 minutes 4 seconds  Findings:                 The perianal and digital rectal examinations were                            normal. Pertinent negatives include normal                            sphincter tone and no palpable rectal lesions.                           The colon (entire examined portion) appeared normal.                           The retroflexed view of the distal rectum and anal  verge was normal and showed no anal or rectal                            abnormalities. Complications:            No immediate complications. Estimated Blood Loss:     Estimated blood loss: none. Impression:               - The entire examined colon is normal.                           - The distal rectum and anal verge are normal on                            retroflexion view.                           - No specimens collected. Recommendation:           - Patient has a contact number available for                            emergencies. The signs and symptoms of potential                            delayed complications were discussed with the                            patient. Return to normal activities tomorrow.                            Written discharge instructions were provided to the                             patient.                           - Resume previous diet.                           - Continue present medications.                           - Repeat colonoscopy in 10 years for screening                            purposes. Leontine Radman E. Cherryl Corona, MD 01/31/2024 8:52:41 AM This report has been signed electronically.

## 2024-02-03 ENCOUNTER — Telehealth: Payer: Self-pay | Admitting: *Deleted

## 2024-02-03 NOTE — Telephone Encounter (Signed)
 Attempted post procedure follow up call.  No answer - LVM.

## 2024-02-26 ENCOUNTER — Other Ambulatory Visit: Payer: Self-pay | Admitting: Medical

## 2024-05-23 ENCOUNTER — Other Ambulatory Visit: Payer: Self-pay | Admitting: Medical

## 2024-06-24 ENCOUNTER — Ambulatory Visit (INDEPENDENT_AMBULATORY_CARE_PROVIDER_SITE_OTHER): Admitting: Nurse Practitioner

## 2024-06-24 ENCOUNTER — Encounter: Payer: Self-pay | Admitting: Nurse Practitioner

## 2024-06-24 VITALS — BP 136/80 | HR 74 | Temp 98.1°F | Ht 67.0 in | Wt 205.0 lb

## 2024-06-24 DIAGNOSIS — J069 Acute upper respiratory infection, unspecified: Secondary | ICD-10-CM | POA: Diagnosis not present

## 2024-06-24 LAB — POC COVID19 BINAXNOW: SARS Coronavirus 2 Ag: NEGATIVE

## 2024-06-24 LAB — POCT INFLUENZA A/B
Influenza A, POC: NEGATIVE
Influenza B, POC: NEGATIVE

## 2024-06-24 LAB — POCT RAPID STREP A (OFFICE): Rapid Strep A Screen: NEGATIVE

## 2024-06-24 NOTE — Progress Notes (Signed)
 Acute Office Visit  Subjective:    Patient ID: Nicole Ryan, female    DOB: November 24, 1975, 48 y.o.   MRN: [id number]  Chief Complaint  Patient presents with   Cough    Started about 2-3 days ago, cough not coughing up mucus able to blow mucus from nose,     URI  This is a new problem. The current episode started in the past 7 days. The problem has been unchanged. There has been no fever. Associated symptoms include congestion, coughing, rhinorrhea, sinus pain and a sore throat. Pertinent negatives include no abdominal pain, chest pain, diarrhea, dysuria, ear pain, headaches, joint pain, joint swelling, nausea, neck pain, plugged ear sensation, rash, sneezing, swollen glands, vomiting or wheezing. Treatments tried: flonase .   Outpatient Medications Prior to Visit  Medication Sig   acetaminophen  (TYLENOL ) 500 MG tablet Take 1 tablet (500 mg total) by mouth every 6 (six) hours as needed.   albuterol  (VENTOLIN  HFA) 108 (90 Base) MCG/ACT inhaler Inhale 2 puffs into the lungs every 6 (six) hours as needed.   amLODipine  (NORVASC ) 5 MG tablet 2 tab po daily   atorvastatin  (LIPITOR) 10 MG tablet Take 1 tablet (10 mg total) by mouth daily.   empagliflozin  (JARDIANCE ) 25 MG TABS tablet Take 1 tablet (25 mg total) by mouth daily.   fluticasone  (FLONASE ) 50 MCG/ACT nasal spray Place 2 sprays into both nostrils daily. (Patient taking differently: Place 2 sprays into both nostrils daily as needed.)   metFORMIN  (GLUCOPHAGE ) 1000 MG tablet TAKE 1 TABLET BY MOUTH TWICE DAILY WITH A MEAL   metoprolol  succinate (TOPROL -XL) 50 MG 24 hr tablet TAKE 1 TABLET BY MOUTH ONCE DAILY. TAKE WITH OR IMMEDIATELY FOLLOWING A MEAL.   montelukast  (SINGULAIR ) 10 MG tablet TAKE 1 TABLET BY MOUTH AT BEDTIME   valsartan  (DIOVAN ) 160 MG tablet Take 1 tablet (160 mg total) by mouth daily.   No facility-administered medications prior to visit.    Reviewed past medical and social history.  Review of Systems  HENT:  Positive  for congestion, rhinorrhea, sinus pain and sore throat. Negative for ear pain and sneezing.   Respiratory:  Positive for cough. Negative for wheezing.   Cardiovascular:  Negative for chest pain.  Gastrointestinal:  Negative for abdominal pain, diarrhea, nausea and vomiting.  Genitourinary:  Negative for dysuria.  Musculoskeletal:  Negative for joint pain and neck pain.  Skin:  Negative for rash.  Neurological:  Negative for headaches.   Per HPI     Objective:    Physical Exam Vitals and nursing note reviewed.  HENT:     Right Ear: Tympanic membrane, ear canal and external ear normal.     Left Ear: Tympanic membrane, ear canal and external ear normal.     Nose: Congestion and rhinorrhea present.     Mouth/Throat:     Pharynx: Posterior oropharyngeal erythema present.  Eyes:     Extraocular Movements: Extraocular movements intact.     Conjunctiva/sclera: Conjunctivae normal.  Cardiovascular:     Rate and Rhythm: Normal rate and regular rhythm.     Pulses: Normal pulses.     Heart sounds: Normal heart sounds.  Pulmonary:     Effort: Pulmonary effort is normal.     Breath sounds: Normal breath sounds.  Lymphadenopathy:     Cervical: No cervical adenopathy.  Neurological:     Mental Status: She is alert and oriented to person, place, and time.    BP 136/80 (BP Location: Left Arm, Patient Position:  Sitting, Cuff Size: Large)   Pulse 74   Temp 98.1 F (36.7 C) (Temporal)   Ht 5' 7 (1.702 m)   Wt 205 lb (93 kg)   SpO2 97%   BMI 32.11 kg/m    Results for orders placed or performed in visit on 06/24/24  POCT Influenza A/B  Result Value Ref Range   Influenza A, POC Negative Negative   Influenza B, POC Negative Negative  POC COVID-19  Result Value Ref Range   SARS Coronavirus 2 Ag Negative Negative  POCT rapid strep A  Result Value Ref Range   Rapid Strep A Screen Negative Negative       Assessment & Plan:   Problem List Items Addressed This Visit   None Visit  Diagnoses       Viral upper respiratory tract infection    -  Primary   Relevant Orders   POCT Influenza A/B (Completed)   POC COVID-19 (Completed)   POCT rapid strep A (Completed)      Continue Flonase   apply 2sprays of flonase  in each nare.  Maintain adequate oral hydration. Avoid decongestants if you have high blood pressure.  Ok to use Coricidin HBP for chest and sinus congestion Use mucinex DM or Robitussin  or delsym for cough without sinus congestion  You can use plain Tylenol  or Advil  for fever, chills and achyness. Use cool mist humidifier at bedtime to help with nasal congestion and cough.  No orders of the defined types were placed in this encounter.  No follow-ups on file.    Roselie Mood, NP

## 2024-06-24 NOTE — Patient Instructions (Addendum)
 URI Instructions: Negative COVID, FLU, and strep  Continue Flonase   apply 2sprays of flonase  in each nare.  Maintain adequate oral hydration. Avoid decongestants if you have high blood pressure.  Ok to use Coricidin HBP for chest and sinus congestion Use mucinex DM or Robitussin  or delsym for cough without sinus congestion  You can use plain Tylenol  or Advil  for fever, chills and achyness. Use cool mist humidifier at bedtime to help with nasal congestion and cough.  Cold/cough medications may have tylenol  or ibuprofen  or guaifenesin or dextromethophan in them, so be careful not to take beyond the recommended dose for each of these medications.   Common cold symptoms are usually triggered by a virus.  The antibiotics are usually not necessary. On average, a viral cold illness may take 7-10 days to resolve. Please, make an appointment if you are not better or if you're worse.

## 2024-07-10 ENCOUNTER — Other Ambulatory Visit: Payer: Self-pay | Admitting: Medical

## 2024-07-10 NOTE — Telephone Encounter (Signed)
 Patient's last blood pressure was high with main and I did give her only a 30-day supply of both medications.  Please call her on Monday to get her to come at for follow-up blood pressure check/office visit with me.  Let me know if she does not get scheduled

## 2024-08-31 ENCOUNTER — Other Ambulatory Visit: Payer: Self-pay | Admitting: Medical

## 2024-09-17 ENCOUNTER — Encounter: Payer: Self-pay | Admitting: Emergency Medicine

## 2024-09-17 ENCOUNTER — Ambulatory Visit: Payer: Self-pay

## 2024-09-17 ENCOUNTER — Ambulatory Visit
Admission: EM | Admit: 2024-09-17 | Discharge: 2024-09-17 | Disposition: A | Discharge status: Home / Self Care | Attending: Family Medicine | Admitting: Family Medicine

## 2024-09-17 DIAGNOSIS — S0081XA Abrasion of other part of head, initial encounter: Secondary | ICD-10-CM

## 2024-09-17 DIAGNOSIS — Z23 Encounter for immunization: Secondary | ICD-10-CM | POA: Diagnosis not present

## 2024-09-17 MED ORDER — TETANUS-DIPHTH-ACELL PERTUSSIS 5-2-15.5 LF-MCG/0.5 IM SUSP
0.5000 mL | Freq: Once | INTRAMUSCULAR | Status: AC
  Administered 2024-09-17: 0.5 mL via INTRAMUSCULAR

## 2024-09-17 NOTE — Discharge Instructions (Signed)
 You were seen today for a abrasion to the face.  There is no laceration for me to sew.  This will heal on its own.  Please continue the neosprin, or trial bacitracin ointment.  Keep covered as needed.

## 2024-09-17 NOTE — ED Triage Notes (Addendum)
 Patient states that she slipped and fell yesterday morning trying to retrieve her dog.  There's a laceration above her top lip.  No loss of consciousness.  Unsure of last Tdap.

## 2024-09-17 NOTE — ED Provider Notes (Addendum)
 " [facility]    CSN: [id number] Arrival date & time: 09/17/24  1005      History   Chief Complaint Chief Complaint  Patient presents with   Laceration    HPI Soumya Colson is a 49 y.o. female.    Laceration  Patient is here for an abrasion to her face above her lip.  Yesterday AM she was out with her dog, fell on her face and scratched above her lip.  Using neosporin and keeping it covered.  No other issues today.        Past Medical History:  Diagnosis Date   Allergy    Diabetes mellitus without complication (HCC)    GERD (gastroesophageal reflux disease)    heart burn. 2-3 times a week.   Headache    Hypertension     Patient Active Problem List   Diagnosis Date Noted   Menorrhagia 10/17/2022   Abnormal vaginal bleeding 10/17/2022   Hypertension 01/04/2021   Nausea 01/04/2021    Past Surgical History:  Procedure Laterality Date   BREAST BIOPSY Bilateral    breast surg Bilateral    Beign tumors both breast   DILITATION & CURRETTAGE/HYSTROSCOPY WITH NOVASURE ABLATION N/A 10/17/2022   Procedure: DILATATION & CURETTAGE/HYSTEROSCOPY WITH NOVASURE ABLATION;  Surgeon: Cleatus Moccasin, MD;  Location: [facility] OR;  Service: Gynecology;  Laterality: N/A;    OB History     Gravida  5   Para  3   Term  3   Preterm      AB  1   Living  3      SAB  1   IAB      Ectopic      Multiple      Live Births  3            Home Medications    Prior to Admission medications  Medication Sig Start Date End Date Taking? Authorizing Provider  acetaminophen  (TYLENOL ) 500 MG tablet Take 1 tablet (500 mg total) by mouth every 6 (six) hours as needed. 10/17/22  Yes Cleatus Moccasin, MD  amLODipine  (NORVASC ) 5 MG tablet Take 2 tablets by mouth once daily 09/01/24  Yes Saguier, [place], PA-C  atorvastatin  (LIPITOR) 10 MG tablet Take 1 tablet by mouth once daily 09/01/24  Yes Saguier, [place], PA-C  fluticasone  (FLONASE ) 50 MCG/ACT nasal spray Place 2 sprays  into both nostrils daily. Patient taking differently: Place 2 sprays into both nostrils daily as needed. 07/17/23  Yes Saguier, [place], PA-C  metFORMIN  (GLUCOPHAGE ) 1000 MG tablet TAKE 1 TABLET BY MOUTH TWICE DAILY WITH A MEAL 05/25/24  Yes Saguier, [place], PA-C  metoprolol  succinate (TOPROL -XL) 50 MG 24 hr tablet TAKE 1 TABLET BY MOUTH ONCE DAILY. TAKE  WITH  OR  IMMEDIATELY  FOLLOWING  A  MEAL 07/10/24  Yes Saguier, [place], PA-C  albuterol  (VENTOLIN  HFA) 108 (90 Base) MCG/ACT inhaler Inhale 2 puffs into the lungs every 6 (six) hours as needed. 08/19/19   Saguier, [place], PA-C  empagliflozin  (JARDIANCE ) 25 MG TABS tablet Take 1 tablet (25 mg total) by mouth daily. 11/13/23   Saguier, [place], PA-C  montelukast  (SINGULAIR ) 10 MG tablet TAKE 1 TABLET BY MOUTH AT BEDTIME 01/15/24   Saguier, [place], PA-C  valsartan  (DIOVAN ) 160 MG tablet Take 1 tablet (160 mg total) by mouth daily. 11/12/23   Saguier, [place], PA-C    Family History Family History  Problem Relation Age of Onset   Aneurysm Mother    Hypertension Mother  Diabetes Mother    Colon cancer Neg Hx    Colon polyps Neg Hx    Esophageal cancer Neg Hx    Stomach cancer Neg Hx    Ulcerative colitis Neg Hx     Social History Social History[1]   Allergies   Patient has no known allergies.   Review of Systems Review of Systems  Constitutional: Negative.   HENT: Negative.    Respiratory: Negative.    Cardiovascular: Negative.   Gastrointestinal: Negative.   Skin:  Positive for wound.     Physical Exam Triage Vital Signs ED Triage Vitals  Encounter Vitals Group     BP 09/17/24 1033 (!) 174/116     Girls Systolic BP Percentile --      Girls Diastolic BP Percentile --      Boys Systolic BP Percentile --      Boys Diastolic BP Percentile --      Pulse Rate 09/17/24 1033 78     Resp 09/17/24 1033 18     Temp 09/17/24 1033 98.3 F (36.8 C)     Temp Source 09/17/24 1033 Oral     SpO2 09/17/24 1033 95 %     Weight 09/17/24  1028 200 lb (90.7 kg)     Height 09/17/24 1028 5' 7 (1.702 m)     Head Circumference --      Peak Flow --      Pain Score 09/17/24 1028 0     Pain Loc --      Pain Education --      Exclude from Growth Chart --    No data found.  Updated Vital Signs BP (!) 174/116 (BP Location: Left Arm)   Pulse 78   Temp 98.3 F (36.8 C) (Oral)   Resp 18   Ht 5' 7 (1.702 m)   Wt 90.7 kg   SpO2 95%   BMI 31.32 kg/m   Visual Acuity Right Eye Distance:   Left Eye Distance:   Bilateral Distance:    Right Eye Near:   Left Eye Near:    Bilateral Near:     Physical Exam Constitutional:      Appearance: Normal appearance. She is normal weight.  Skin:    Comments: At the philtrum is a superficial abrasion, less then dime sized;  no active bleeding.  No laceration is noted  Neurological:     General: No focal deficit present.     Mental Status: She is alert.  Psychiatric:        Mood and Affect: Mood normal.      UC Treatments / Results  Labs (all labs ordered are listed, but only abnormal results are displayed) Labs Reviewed - No data to display  EKG   Radiology No results found.  Procedures Procedures (including critical care time)  Medications Ordered in UC Medications  Tdap (ADACEL ) injection 0.5 mL (has no administration in time range)    Initial Impression / Assessment and Plan / UC Course  I have reviewed the triage vital signs and the nursing notes.  Pertinent labs & imaging results that were available during my care of the patient were reviewed by me and considered in my medical decision making (see chart for details).    Final Clinical Impressions(s) / UC Diagnoses   Final diagnoses:  Abrasion of face, initial encounter     Discharge Instructions      You were seen today for a abrasion to the face.  There is no  laceration for me to sew.  This will heal on its own.  Please continue the neosprin, or trial bacitracin ointment.  Keep covered as needed.      ED Prescriptions   None    PDMP not reviewed this encounter.    Darral Longs, MD 09/17/24 1043     [1]  Social History Tobacco Use   Smoking status: Never   Smokeless tobacco: Never  Vaping Use   Vaping status: Never Used  Substance Use Topics   Alcohol use: Not Currently    Comment: occasionally   Drug use: Never     Darral Longs, MD 09/17/24 1045  "

## 2024-09-17 NOTE — Telephone Encounter (Signed)
 FYI Only or Action Required?: Action required by provider: request for appointment, clinical question for provider, and update on patient condition.  Patient was last seen in primary care on 06/24/2024 by Nche, Nicole Rockford, NP.  Called Nurse Triage reporting Fall and Lip Laceration.  Symptoms began yesterday.  Interventions attempted: Rest, hydration, or home remedies.  Symptoms are: stable.  Triage Disposition: Go to ED Now (Notify PCP)  Patient/caregiver understands and will follow disposition?: No, refuses disposition       Reason for Disposition  Skin is split open or gaping (or length > 1/2 inch or 12 mm on the skin, 1/4 inch or 6 mm on the face)  Answer Assessment - Initial Assessment Questions This RN recommended pt be examined in hospital, pt refusing, requesting appt today, requesting call back asap. Advised pt get to hospital asap if any new or worsening symptoms. Sending message to PCP office for call back to pt with further recommendations. Alerted [facility] to ED refusal.     1. APPEARANCE of INJURY: What does the injury look like?      Red looks like blood, it's just that red Do see down to the white Lip is swollen Looks bad  2. ONSET: How long ago did the injury occur?      1 am yesterday morning  3. LOCATION: Where is the injury located?      Lip busted, swollen, between nose and lip scar, no skin there at all it's gone, 1 nostril size, doesn't go all the way across, left side under left nostril, big enough to cover with bandage  5. BLEEDING: Is it bleeding now? If Yes, ask: Is it difficult to stop?      Not really bleeding but don't know because keep it covered, wet, no discharge  6. PAIN: Is there any pain? If Yes, ask: How bad is the pain? (Scale 0-10; or none, mild, moderate, severe)     Not in lot of pain  7. MECHANISM: Tell me how it happened.      Dog got away, went outside to get her, tried to go up hill, slipped, face made contact  with the ice  No headache Also contact with thighs, right arm and shoulder, scraped elbow, then rest of body feels okay  Also think gas pains because not been moving around like usually do Pass gas well and burp well when get up and move around Right up under my ribcage on left side no pain now but did earlier, had been in bed for so long, wasn't moving around, woke me up, then once got moving, subsided and went away  Denies: Chest pain SOB Other symptoms  Protocols used: Cuts and Lacerations-A-AH

## 2024-09-17 NOTE — Telephone Encounter (Signed)
 Called pt and was advised to go ER or urgent. Pt stated understand.
